# Patient Record
Sex: Male | Born: 1935 | Race: White | Hispanic: No | Marital: Married | Smoking: Former smoker
Health system: Southern US, Community
[De-identification: ages and names within clinical notes are randomized; demographics above are authoritative.]

## PROBLEM LIST (undated history)

## (undated) DIAGNOSIS — M47812 Spondylosis without myelopathy or radiculopathy, cervical region: Secondary | ICD-10-CM

## (undated) DIAGNOSIS — M1990 Unspecified osteoarthritis, unspecified site: Secondary | ICD-10-CM

## (undated) DIAGNOSIS — C61 Malignant neoplasm of prostate: Secondary | ICD-10-CM

## (undated) DIAGNOSIS — I639 Cerebral infarction, unspecified: Secondary | ICD-10-CM

## (undated) DIAGNOSIS — E785 Hyperlipidemia, unspecified: Secondary | ICD-10-CM

## (undated) DIAGNOSIS — I10 Essential (primary) hypertension: Secondary | ICD-10-CM

## (undated) DIAGNOSIS — I4891 Unspecified atrial fibrillation: Secondary | ICD-10-CM

## (undated) HISTORY — DX: Essential (primary) hypertension: I10

## (undated) HISTORY — PX: CAROTID ENDARTERECTOMY: SUR193

## (undated) HISTORY — DX: Malignant neoplasm of prostate: C61

## (undated) HISTORY — DX: Spondylosis without myelopathy or radiculopathy, cervical region: M47.812

## (undated) HISTORY — PX: KNEE SURGERY: SHX244

## (undated) HISTORY — PX: OTHER SURGICAL HISTORY: SHX169

## (undated) HISTORY — DX: Unspecified osteoarthritis, unspecified site: M19.90

## (undated) HISTORY — DX: Cerebral infarction, unspecified: I63.9

## (undated) HISTORY — DX: Unspecified atrial fibrillation: I48.91

## (undated) HISTORY — PX: TONSILLECTOMY: SUR1361

## (undated) HISTORY — DX: Hyperlipidemia, unspecified: E78.5

---

## 1999-02-03 ENCOUNTER — Encounter: Payer: Self-pay | Admitting: Cardiology

## 2000-08-10 ENCOUNTER — Encounter: Admission: RE | Admit: 2000-08-10 | Discharge: 2000-08-10 | Payer: Self-pay | Admitting: Orthopedic Surgery

## 2000-08-10 ENCOUNTER — Encounter: Payer: Self-pay | Admitting: Orthopedic Surgery

## 2001-06-15 ENCOUNTER — Ambulatory Visit: Admission: RE | Admit: 2001-06-15 | Discharge: 2001-06-15 | Payer: Self-pay | Admitting: Gastroenterology

## 2001-06-15 ENCOUNTER — Encounter: Payer: Self-pay | Admitting: Gastroenterology

## 2007-06-04 ENCOUNTER — Encounter: Payer: Self-pay | Admitting: Cardiology

## 2008-12-29 ENCOUNTER — Encounter: Payer: Self-pay | Admitting: Cardiology

## 2008-12-30 ENCOUNTER — Encounter: Payer: Self-pay | Admitting: Cardiology

## 2008-12-31 ENCOUNTER — Encounter: Payer: Self-pay | Admitting: Cardiology

## 2009-02-19 ENCOUNTER — Telehealth (INDEPENDENT_AMBULATORY_CARE_PROVIDER_SITE_OTHER): Payer: Self-pay | Admitting: *Deleted

## 2009-02-19 ENCOUNTER — Encounter: Payer: Self-pay | Admitting: Internal Medicine

## 2009-02-25 ENCOUNTER — Telehealth: Payer: Self-pay | Admitting: Internal Medicine

## 2009-02-26 ENCOUNTER — Ambulatory Visit (HOSPITAL_COMMUNITY): Admission: RE | Admit: 2009-02-26 | Discharge: 2009-02-26 | Payer: Self-pay | Admitting: Internal Medicine

## 2009-02-26 ENCOUNTER — Ambulatory Visit: Payer: Self-pay | Admitting: Internal Medicine

## 2009-02-26 ENCOUNTER — Encounter: Payer: Self-pay | Admitting: Internal Medicine

## 2009-03-08 ENCOUNTER — Telehealth: Payer: Self-pay | Admitting: Cardiology

## 2009-03-10 ENCOUNTER — Encounter: Payer: Self-pay | Admitting: Cardiology

## 2009-05-06 ENCOUNTER — Encounter: Payer: Self-pay | Admitting: Cardiology

## 2009-06-30 ENCOUNTER — Ambulatory Visit: Payer: Self-pay | Admitting: Cardiology

## 2009-06-30 DIAGNOSIS — I10 Essential (primary) hypertension: Secondary | ICD-10-CM

## 2009-06-30 DIAGNOSIS — E876 Hypokalemia: Secondary | ICD-10-CM

## 2009-06-30 DIAGNOSIS — I2510 Atherosclerotic heart disease of native coronary artery without angina pectoris: Secondary | ICD-10-CM | POA: Insufficient documentation

## 2009-06-30 DIAGNOSIS — I6529 Occlusion and stenosis of unspecified carotid artery: Secondary | ICD-10-CM

## 2009-06-30 DIAGNOSIS — E782 Mixed hyperlipidemia: Secondary | ICD-10-CM | POA: Insufficient documentation

## 2009-06-30 DIAGNOSIS — I4891 Unspecified atrial fibrillation: Secondary | ICD-10-CM | POA: Insufficient documentation

## 2009-06-30 DIAGNOSIS — H538 Other visual disturbances: Secondary | ICD-10-CM

## 2009-07-01 ENCOUNTER — Encounter: Payer: Self-pay | Admitting: Cardiology

## 2009-07-09 ENCOUNTER — Encounter (INDEPENDENT_AMBULATORY_CARE_PROVIDER_SITE_OTHER): Payer: Self-pay | Admitting: *Deleted

## 2009-07-14 ENCOUNTER — Ambulatory Visit: Payer: Self-pay | Admitting: Cardiology

## 2009-07-14 LAB — CONVERTED CEMR LAB: POC INR: 3.9

## 2009-07-17 ENCOUNTER — Ambulatory Visit: Payer: Self-pay | Admitting: Cardiology

## 2009-07-17 LAB — CONVERTED CEMR LAB: POC INR: 3

## 2009-07-21 ENCOUNTER — Ambulatory Visit: Payer: Self-pay | Admitting: Cardiology

## 2009-07-21 LAB — CONVERTED CEMR LAB: POC INR: 2.8

## 2009-07-31 ENCOUNTER — Ambulatory Visit: Payer: Self-pay | Admitting: Cardiology

## 2009-07-31 LAB — CONVERTED CEMR LAB: POC INR: 2.2

## 2009-08-06 ENCOUNTER — Ambulatory Visit: Payer: Self-pay | Admitting: Cardiology

## 2009-08-06 DIAGNOSIS — R198 Other specified symptoms and signs involving the digestive system and abdomen: Secondary | ICD-10-CM

## 2009-08-18 ENCOUNTER — Ambulatory Visit: Payer: Self-pay | Admitting: Cardiology

## 2009-08-18 LAB — CONVERTED CEMR LAB: POC INR: 2.1

## 2009-09-08 ENCOUNTER — Ambulatory Visit: Payer: Self-pay | Admitting: Cardiology

## 2009-09-08 LAB — CONVERTED CEMR LAB: POC INR: 2.4

## 2009-10-06 ENCOUNTER — Ambulatory Visit: Payer: Self-pay | Admitting: Cardiology

## 2009-10-06 LAB — CONVERTED CEMR LAB: POC INR: 2.6

## 2009-11-03 ENCOUNTER — Ambulatory Visit: Payer: Self-pay | Admitting: Cardiology

## 2009-11-03 LAB — CONVERTED CEMR LAB: POC INR: 2.2

## 2009-12-01 ENCOUNTER — Ambulatory Visit: Payer: Self-pay | Admitting: Cardiology

## 2009-12-29 ENCOUNTER — Ambulatory Visit: Payer: Self-pay | Admitting: Cardiology

## 2009-12-29 LAB — CONVERTED CEMR LAB: POC INR: 0.9

## 2010-01-12 ENCOUNTER — Ambulatory Visit: Payer: Self-pay | Admitting: Cardiology

## 2010-01-12 LAB — CONVERTED CEMR LAB: POC INR: 1.5

## 2010-01-22 ENCOUNTER — Ambulatory Visit: Payer: Self-pay | Admitting: Cardiology

## 2010-02-05 ENCOUNTER — Ambulatory Visit: Payer: Self-pay | Admitting: Cardiology

## 2010-02-05 LAB — CONVERTED CEMR LAB: POC INR: 3.3

## 2010-02-23 ENCOUNTER — Ambulatory Visit: Payer: Self-pay | Admitting: Cardiology

## 2010-03-09 ENCOUNTER — Ambulatory Visit: Payer: Self-pay | Admitting: Cardiology

## 2010-03-09 LAB — CONVERTED CEMR LAB: POC INR: 3.2

## 2010-04-06 ENCOUNTER — Ambulatory Visit: Payer: Self-pay | Admitting: Cardiology

## 2010-05-04 ENCOUNTER — Ambulatory Visit: Admission: RE | Admit: 2010-05-04 | Discharge: 2010-05-04 | Payer: Self-pay | Source: Home / Self Care

## 2010-05-04 LAB — CONVERTED CEMR LAB: POC INR: 2.2

## 2010-05-27 NOTE — Assessment & Plan Note (Signed)
Summary: NP-AFIB & PARAOXYSMAL  427.31   Visit Type:  Initial Consult Primary Provider:  Vyas  CC:  new patient and irregular heartbeat.  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  There were also tiny acute right occipital lobe infarct.  The patient was evaluated by neurology and it was felt that his stroke was embolic in nature.  Visual field perimetry showed partial right superior field defect consistent with left occipital infarct the patient had a transcranial Doppler study done which was negative.  The patient also the present material echocardiographic study done which showed no definite source of embolism.  However in the interim the patient had a LifeWatch monitor done which demonstrated paroxysmal atrial fibrillation.  Currently the patient is not on anticoagulation.  The patient now presents for a discussion regarding anticoagulation.  Of note is also that the patient has Parkinson's disease but reports no falls.  Although he has a shuffling gait he reports no unsteadiness or gait imbalance.  The patient denies any substernal chest pain shortness of breath orthopnea or PND.  The patient is very reluctant to consider Coumadin therapy.  He doesn't like the idea to be placed on Coumadin with frequent monitoring of his PT/INR.  He states this would interfere with his lifestyle as he has to take care of his sick wife.  Is also noted that these hypertensive in the office today.  Clinical Review Panels:  Echocardiogram Echocardiogram Transesophageal Echocardiography   Study Conclusions    1. Left ventricle: Systolic function was normal.   2. Aortic valve: Mild regurgitation.   3. Left atrium: No evidence of thrombus in the atrial cavity or      appendage.   4. Right atrium: No evidence of thrombus in the atrial cavity or      appendage.   5. Atrial septum: No defect or patent  foramen ovale was identified      with color doppler. Echo contrast study showed no right-to-left      atrial level shunt, at baseline or with provocation.    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 Take 1 Tablet By Mouth Once A Day 2)  Amantadine Hcl 100 Mg Caps (Amantadine Hcl) .... Take 1 Tablet By Mouth Two Times A Day 3)  Omeprazole 20 Mg Cpdr (Omeprazole) .... Take 1 Tablet By Mouth Two Times A Day 4)  Vytorin 10-20 Mg Tabs (Ezetimibe-Simvastatin) .... Take 1 Tablet By Mouth Once A Day 5)  Nabumetone 500 Mg Tabs (Nabumetone) .... Take 1 Tablet By Mouth Two Times A Day  As Needed 6)  Chlorthalidone 25 Mg Tabs (Chlorthalidone) .... Take 1 Tablet By Mouth Once A Day 7)  Carbidopa-Levodopa 25-100 Mg Tabs (Carbidopa-Levodopa) .... Take 1 Tablet By Mouth Four Times A Day 8)  Aspir-Low 81 Mg Tbec (Aspirin) .... Take 1 Tablet By Mouth Once A Day 9)  Vitamin C 500 Mg  Tabs (Ascorbic Acid) .... Take 1 Tablet By Mouth Once A Day 10)  Multivitamins  Tabs (Multiple Vitamin) .... Take 1 Tablet By Mouth Once A Day 11)  Tylenol Extra Strength 500 Mg Tabs (Acetaminophen) .... As Needed 12)  Fish Oil 1200 Mg Caps (Omega-3 Fatty Acids) .... Take 1 Tablet By Mouth Once A Day 13)  Glucosamine-Chondroitin 1500-1200 Mg/63ml Liqd (Glucosamine-Chondroitin) .... Take 1 Tablet By Mouth Once A Day 14)  Natural Herbal Diuretic Plus  Tabs (Misc Natural  Products) .... Take 1 Tablet By Mouth Once A Day 15)  Pradaxa 150 Mg Caps (Dabigatran Etexilate Mesylate) .... Take 1 Tablet By Mouth Two Times A Day  Allergies (verified): No Known Drug Allergies  Comments:  Nurse/Medical Assistant: The patient's medications and allergies were reviewed with the patient and were updated in the Medication and Allergy Lists. List reviewed.  Past History:  Past Medical History: Last updated: July 15, 2009 Hypertension Parkinson;s Disease Prostate Cancer Osteoarthritis Cervical DJD Hyperlipidemia Endarterectomy in the Rt. Carotid 1994  Past Surgical History: Last updated: July 15, 2009 Cataract Extraction Tonsillectomy Knee surgery  Lumbar Diskectomy twice   Family History: Last updated: 07/15/09 Father: had MI and deceased Mother: deceased of old age  Social History: Last updated: 2009-07-15 Alcohol Use - no Retired  Married  Drug Use - no  Risk Factors: Smoking Status: quit (Jul 15, 2009)  Social History: Smoking Status:  quit  Review of Systems  The patient denies fatigue, malaise, fever, weight gain/loss, vision loss, decreased hearing, hoarseness, chest pain, palpitations, shortness of breath, prolonged cough, wheezing, sleep apnea, coughing up blood, abdominal pain, blood in stool, nausea, vomiting, diarrhea, heartburn, incontinence, blood in urine, muscle weakness, joint pain, leg swelling, rash, skin lesions, headache, fainting, dizziness, depression, anxiety, enlarged lymph nodes, easy bruising or bleeding, and environmental allergies.    Vital Signs:  Patient profile:   77 year old male Height:      65 inches Weight:      179 pounds BMI:     29.89 Pulse rate:   69 / minute BP sitting:   149 / 91  (left arm) Cuff size:   regular  Vitals Entered By: Carlye Grippe July 15, 2009 10:08 AM)  Nutrition Counseling: Patient's BMI is greater than 25 and therefore counseled on weight management options. CC: new  patient,irregular heartbeat   Physical Exam  Additional Exam:  General: Well-developed, well-nourished in no distress head: Normocephalic and atraumatic eyes PERRLA/EOMI intact, conjunctiva and lids normal nose: No deformity or lesions mouth normal dentition, normal posterior pharynx neck: Supple, no JVD.  No masses, thyromegaly or abnormal cervical nodes lungs: Normal breath sounds bilaterally without wheezing.  Normal percussion heart: regular rate and rhythm with normal S1 and S2, no S3 or S4.  PMI is normal.  No pathological murmurs abdomen: Normal bowel sounds, abdomen is soft and nontender without masses, organomegaly or hernias noted.  No hepatosplenomegaly musculoskeletal: Back normal, normal gait muscle strength and tone normal pulsus: Pulse is normal in all 4 extremities Extremities: No peripheral pitting edema neurologic: Alert and oriented x 3, the patient has shuffling gait and acatesis. skin: Intact without lesions or rashes cervical nodes: No significant adenopathy psychologic: Normal affect    EKG  Procedure date:  07-15-2009  Findings:      normal sinus rhythm.  Nonspecific ST-T wave changes.  Heart rate 69 beats/min.  Impression & Recommendations:  Problem # 1:  ATRIAL FIBRILLATION (ICD-427.31) the patient has documented paroxysmal atrial fibrillation by LifeWatch monitor.  Clinically he  also had a recent thromboembolic event involving the occipital lobe corroborated by appropriate visual field defects.  I discussed withthe patient daily for added quite a therapy.  For reasons outlined above he declines Coumadin.  However is willing to consider dabigatran therapy.  The patient will be started on dabigatran 150 milligramsp.o. b.i.d. after a BMET has been obtained.  The patient will be monitored for normal renal function.had a long discussion with the patient regarding the risk and benefits of anticoagulant therapy. His updated medication list for this problem  includes:    Aspir-low 81 Mg Tbec (Aspirin) .Marland Kitchen... Take 1 tablet by mouth once a day  Orders: EKG w/ Interpretation (93000) T-Basic Metabolic Panel ([address]-98119)  Problem # 2:  ESSENTIAL HYPERTENSION, BENIGN (ICD-401.1) the patient has uncontrolled high blood pressure.  He will be started on chlorthalidone 25 monos p.o. daily. His updated medication list for this problem includes:    Chlorthalidone 25 Mg Tabs (Chlorthalidone) .Marland Kitchen... Take 1 tablet by mouth once a day    Aspir-low 81 Mg Tbec (Aspirin) .Marland Kitchen... Take 1 tablet by mouth once a day  Problem # 3:  CORONARY ATHEROSCLEROSIS NATIVE CORONARY ARTERY (ICD-414.01) the patient is status post coronary artery bypass grafting.  However clinically there is no evidence of ischemia.  there is no definite indication for Cardiolite stress test at the present time. His updated medication list for this problem includes:    Aspir-low 81 Mg Tbec (Aspirin) .Marland Kitchen... Take 1 tablet by mouth once a day  Orders: T-Basic Metabolic Panel [id number])  Problem # 4:  OTHER SPECIFIED VISUAL DISTURBANCES (ICD-368.8) Assessment: Comment Only  Patient Instructions: 1)  Pradaxa 150mg  two times a day  2)  Need BMET prior to starting.  3)  Stop HCTZ 4)  Start Chlorhtalidone 25mg  daily 5)  Follow up in  1 month.  (April 14 at 8:45, Thursday) Prescriptions: CHLORTHALIDONE 25 MG TABS (CHLORTHALIDONE) Take 1 tablet by mouth once a day  #30 x 6   Entered by:   Hoover Brunette, LPN   Authorized by:   Lewayne Bunting, MD, [facility]   Signed by:   Hoover Brunette, LPN on 30/86/5784   Method used:   Electronically to        [facility]* (retail)       [address]       [place], [place]  [address]       Ph: [phone]       Fax: [phone]   RxID:   [id number]   Handout requested. PRADAXA 150 MG CAPS (DABIGATRAN ETEXILATE MESYLATE) Take 1 tablet by mouth two times a day  #60 x 6   Entered by:   Hoover Brunette, LPN   Authorized by:   Lewayne Bunting, MD, [facility]   Signed  by:   Hoover Brunette, LPN on 43/32/9518   Method used:   Electronically to        [facility]* (retail)       [address]       [place], [place]  [address]       Ph: [phone]       Fax: [phone]   RxID:   [id number]   Handout requested.    Appended Document: NP-AFIB & PARAOXYSMAL  427.31 Pt. has decided to start Coumadin instead of Pradaxa due to cost.  Dr. Andee Lineman aware and okay to start on 5mg  daily.  Will start tomorrow  with first check in our coumadin clinic on Tuesday, 3/22 at 3:45.  Patient notified.   Will send rx to [facility].    Clinical Lists Changes  Medications: Added new medication of WARFARIN SODIUM 5 MG TABS (WARFARIN SODIUM) take as directed per coumadin clinic - Signed Rx of WARFARIN SODIUM 5 MG TABS (WARFARIN SODIUM) take as directed per coumadin clinic;  #30 x 2;  Signed;  Entered by: Hoover Brunette, LPN;  Authorized by: Lewayne Bunting, MD, [facility];  Method used: Electronically to [facility] [address], [place], [place], [place]  [address], Ph: [phone], Fax: [phone]    Prescriptions: WARFARIN SODIUM 5 MG TABS (WARFARIN SODIUM) take as directed per coumadin clinic  #30 x 2   Entered by:   Hoover Brunette, LPN   Authorized by:   Lewayne Bunting, MD, [facility]   Signed by:   Hoover Brunette, LPN on 78/46/9629   Method used:   Electronically to        [facility] (retail)       [address]       [place], [place]  [address]       Ph: [phone]       Fax: [phone]   RxID:   [id number]    Appended Document: NP-AFIB & PARAOXYSMAL  427.31 Decrease pt's ASA to 81mg  after therapeutic per Dr. Andee Lineman.

## 2010-05-27 NOTE — Medication Information (Signed)
Summary: ccr-lr  Anticoagulant Therapy  Managed by: Vashti Hey, RN PCP: Lucianne Muss MD: Antoine Poche MD, Fayrene Fearing Indication 1: Atrial Fibrillation Lab Used: [facility] [place] Site: [place] INR POC 2.5  Dietary changes: no    Health status changes: no    Bleeding/hemorrhagic complications: no    Recent/future hospitalizations: no    Any changes in medication regimen? no    Recent/future dental: no  Any missed doses?: no       Is patient compliant with meds? yes       Allergies: No Known Drug Allergies  Anticoagulation Management History:      The patient is taking warfarin and comes in today for a routine follow up visit.  Positive risk factors for bleeding include an age of 19 years or older and history of CVA/TIA.  Negative risk factors for bleeding include no history of GI bleeding and absence of serious comorbidities.  The bleeding index is 'intermediate risk'.  Positive CHADS2 values include History of HTN, Age > 79 years old, and Prior Stroke/CVA/TIA.  Negative CHADS2 values include History of CHF and History of Diabetes.  Anticoagulation responsible Seraya Jobst: Antoine Poche MD, Fayrene Fearing.  INR POC: 2.5.  Cuvette Lot#: [id number].    Anticoagulation Management Assessment/Plan:      The patient's current anticoagulation dose is Warfarin sodium 5 mg tabs: take as directed per coumadin clinic.  The target INR is 2.0-3.0.  The next INR is due 12/29/2009.  Anticoagulation instructions were given to patient.  Results were reviewed/authorized by Vashti Hey, RN.  He was notified by Vashti Hey RN.         Prior Anticoagulation Instructions: INR 2.2 Continue coumadin 2.5mg  once daily except none on Wednesdays  Current Anticoagulation Instructions: INR 2.5 Continue coumadin 2.5mg  once daily except none on Wenesdays

## 2010-05-27 NOTE — Medication Information (Signed)
Summary: ccr-lr  Anticoagulant Therapy  Managed by: Vashti Hey, RN PCP: Lucianne Muss MD: Diona Browner MD, Remi Deter Indication 1: Atrial Fibrillation Lab Used: [facility] [place] Site: [place] INR POC 4.5  Dietary changes: no    Health status changes: no    Bleeding/hemorrhagic complications: no    Recent/future hospitalizations: no    Any changes in medication regimen? no    Recent/future dental: no  Any missed doses?: no       Is patient compliant with meds? yes       Allergies: No Known Drug Allergies  Anticoagulation Management History:      The patient is taking warfarin and comes in today for a routine follow up visit.  Positive risk factors for bleeding include an age of 75 years or older and history of CVA/TIA.  Negative risk factors for bleeding include no history of GI bleeding and absence of serious comorbidities.  The bleeding index is 'intermediate risk'.  Positive CHADS2 values include History of HTN, Age > 75 years old, and Prior Stroke/CVA/TIA.  Negative CHADS2 values include History of CHF and History of Diabetes.  Anticoagulation responsible provider: Diona Browner MD, Remi Deter.  INR POC: 4.5.  Cuvette Lot#: [id number].    Anticoagulation Management Assessment/Plan:      The patient's current anticoagulation dose is Warfarin sodium 5 mg tabs: take as directed per coumadin clinic.  The target INR is 2.0-3.0.  The next INR is due 02/05/2010.  Anticoagulation instructions were given to patient.  Results were reviewed/authorized by Vashti Hey, RN.  He was notified by Vashti Hey RN.         Prior Anticoagulation Instructions: INR 1.5 Has been off coumadin 3 days Take coumadin 5mg  tonight then resume 2.5mg  once daily except none on Wednesdays  Current Anticoagulation Instructions: INR 4.5 Hold coumadin tonight then resume 2.5mg  once daily except none on Wednesdays

## 2010-05-27 NOTE — Medication Information (Signed)
Summary: ccr-lr  Anticoagulant Therapy  Managed by: Vashti Hey, RN PCP: Lucianne Muss MD: Andee Lineman MD, Michelle Piper Indication 1: Atrial Fibrillation Lab Used: [facility] [place] Site: [place] INR POC 3.3  Dietary changes: no    Health status changes: no    Bleeding/hemorrhagic complications: no    Recent/future hospitalizations: no    Any changes in medication regimen? no    Recent/future dental: no  Any missed doses?: no       Is patient compliant with meds? yes       Allergies: No Known Drug Allergies  Anticoagulation Management History:      The patient is taking warfarin and comes in today for a routine follow up visit.  Positive risk factors for bleeding include an age of 75 years or older and history of CVA/TIA.  Negative risk factors for bleeding include no history of GI bleeding and absence of serious comorbidities.  The bleeding index is 'intermediate risk'.  Positive CHADS2 values include History of HTN, Age > 46 years old, and Prior Stroke/CVA/TIA.  Negative CHADS2 values include History of CHF and History of Diabetes.  Anticoagulation responsible provider: Andee Lineman MD, Michelle Piper.  INR POC: 3.3.  Cuvette Lot#: [id number].    Anticoagulation Management Assessment/Plan:      The patient's current anticoagulation dose is Warfarin sodium 5 mg tabs: take as directed per coumadin clinic.  The target INR is 2.0-3.0.  The next INR is due 03/05/2010.  Anticoagulation instructions were given to patient.  Results were reviewed/authorized by Vashti Hey, RN.  He was notified by Vashti Hey RN.         Prior Anticoagulation Instructions: INR 4.5 Hold coumadin tonight then resume 2.5mg  once daily except none on Wednesdays  Current Anticoagulation Instructions: INR 3.3 Take coumadin 1/4 tablet tonight then resume 2.5mg  once daily except none on Wednesdays

## 2010-05-27 NOTE — Medication Information (Signed)
Summary: ccr-lr  Anticoagulant Therapy  Managed by: Vashti Hey, RN PCP: Lucianne Muss MD: Andee Lineman MD, Michelle Piper Indication 1: Atrial Fibrillation Lab Used: [facility] [place] Site: [place] INR POC 2.2  Dietary changes: no    Health status changes: no    Bleeding/hemorrhagic complications: no    Recent/future hospitalizations: no    Any changes in medication regimen? no    Recent/future dental: no  Any missed doses?: yes     Details: Missed 2 doses  Is patient compliant with meds? yes       Allergies: No Known Drug Allergies  Anticoagulation Management History:      The patient is taking warfarin and comes in today for a routine follow up visit.  Positive risk factors for bleeding include an age of 75 years or older and history of CVA/TIA.  Negative risk factors for bleeding include no history of GI bleeding and absence of serious comorbidities.  The bleeding index is 'intermediate risk'.  Positive CHADS2 values include History of HTN, Age > 35 years old, and Prior Stroke/CVA/TIA.  Negative CHADS2 values include History of CHF and History of Diabetes.  Anticoagulation responsible provider: Andee Lineman MD, Michelle Piper.  INR POC: 2.2.  Cuvette Lot#: [id number].    Anticoagulation Management Assessment/Plan:      The patient's current anticoagulation dose is Warfarin sodium 5 mg tabs: take as directed per coumadin clinic.  The target INR is 2.0-3.0.  The next INR is due 04/06/2010.  Anticoagulation instructions were given to patient.  Results were reviewed/authorized by Vashti Hey, RN.  He was notified by Vashti Hey RN.         Prior Anticoagulation Instructions: INR 3.2 Continue coumadin 2.5mg  once daily except none on Wednesdays May need to change tablet strength to 2.5mg  next visit  Current Anticoagulation Instructions: INR 2.2 Continue coumadin 2.5mg  once daily except none on Wednesdays

## 2010-05-27 NOTE — Letter (Signed)
Summary: External Correspondence/ OFFICE VISIT [facility]  External Correspondence/ OFFICE VISIT [facility]   Imported By: Dorise Hiss 06/30/2009 10:18:40  _____________________________________________________________________  External Attachment:    Type:   Image     Comment:   External Document

## 2010-05-27 NOTE — Assessment & Plan Note (Signed)
Summary: 1 MO FU -SRS   Visit Type:  Follow-up Primary Provider:  Sherril Croon  CC:  follow-up visit.  History of Present Illness: the patient is a 77 year old male with a history of prior CVA, paroxysmal atrial fibrillation, on Coumadin therapy as well as is her Parkinson's disease. The patient presents for followup. He reports no recurrent palpitations. During activity he feels some increasing heart rates but is not really symptomatic. He feels that his legs get weak sometimes but denies any frank claudication. He denies any chest pain orthopnea PND. He has difficulty with bloating as well as poorly formed stools. He denies any frank diarrhea.  Is otherwise stable from a cardiovascular standpoint and reports no complications related to Coumadin.  Clinical Review Panels:  Echocardiogram Echocardiogram Transesophageal Echocardiography   Study Conclusions    1. Left ventricle: Systolic function was normal.   2. Aortic valve: Mild regurgitation.   3. Left atrium: No evidence of thrombus in the atrial cavity or      appendage.   4. Right atrium: No evidence of thrombus in the atrial cavity or      appendage.   5. Atrial septum: No defect or patent foramen ovale was identified      with color doppler. Echo contrast study showed no right-to-left      atrial level shunt, at baseline or with provocation.    (02/26/2009)    Preventive Screening-Counseling & Management  Alcohol-Tobacco     Smoking Status: quit     Year Quit: 1977  Current Medications (verified): 1)  Azilect 1 Mg Tabs (Rasagiline Mesylate) .... Take 1 Tablet By Mouth Once A Day 2)  Amantadine Hcl 100 Mg Caps (Amantadine Hcl) .... Take 1 Tablet By Mouth Two Times A Day 3)  Omeprazole 20 Mg Cpdr (Omeprazole) .... Take 1 Tablet By Mouth Two Times A Day 4)  Vytorin 10-20 Mg Tabs (Ezetimibe-Simvastatin) .... Take 1 Tablet By Mouth Once A Day 5)  Nabumetone 500 Mg Tabs (Nabumetone) .... Take 1 Tablet By Mouth Two Times A Day  As  Needed 6)  Chlorthalidone 25 Mg Tabs (Chlorthalidone) .... Take 1 Tablet By Mouth Once A Day 7)  Carbidopa-Levodopa 25-100 Mg Tabs (Carbidopa-Levodopa) .... Take 1 Tablet By Mouth Four Times A Day 8)  Vitamin C 500 Mg Tabs (Ascorbic Acid) .... Take 1 Tablet By Mouth Once A Day 9)  Multivitamins  Tabs (Multiple Vitamin) .... Take 1 Tablet By Mouth Once A Day 10)  Tylenol Extra Strength 500 Mg Tabs (Acetaminophen) .... As Needed 11)  Fish Oil 1200 Mg Caps (Omega-3 Fatty Acids) .... Take 1 Tablet By Mouth Once A Day 12)  Glucosamine-Chondroitin 1500-1200 Mg/28ml Liqd (Glucosamine-Chondroitin) .... Take 1 Tablet By Mouth Once A Day 13)  Natural Herbal Diuretic Plus  Tabs (Misc Natural Products) .... Take 1 Tablet By Mouth Once A Day 14)  Pradaxa 150 Mg Caps (Dabigatran Etexilate Mesylate) .... Take 1 Tablet By Mouth Two Times A Day 15)  Warfarin Sodium 5 Mg Tabs (Warfarin Sodium) .... Take As Directed Per Coumadin Clinic  Allergies (verified): No Known Drug Allergies  Comments:  Nurse/Medical Assistant: The patient is currently on medications but does not know the name or dosage at this time. Instructed to contact our office with details. Will update medication list at that time.  Past History:  Past Medical History: Last updated: 06/30/2009 Hypertension Parkinson;s Disease Prostate Cancer Osteoarthritis Cervical DJD Hyperlipidemia Endarterectomy in the Rt. Carotid 1994  Past Surgical History: Last updated: 06/30/2009 Cataract  Extraction Tonsillectomy Knee surgery  Lumbar Diskectomy twice   Family History: Last updated: 2009-07-10 Father: had MI and deceased Mother: deceased of old age  Social History: Last updated: Jul 10, 2009 Alcohol Use - no Retired  Married  Drug Use - no  Risk Factors: Smoking Status: quit (08/06/2009)  Review of Systems       The patient complains of shortness of breath.  The patient denies fatigue, malaise, fever, weight gain/loss, vision  loss, decreased hearing, hoarseness, chest pain, palpitations, prolonged cough, wheezing, sleep apnea, coughing up blood, abdominal pain, blood in stool, nausea, vomiting, diarrhea, heartburn, incontinence, blood in urine, muscle weakness, joint pain, leg swelling, rash, skin lesions, headache, fainting, dizziness, depression, anxiety, enlarged lymph nodes, easy bruising or bleeding, and environmental allergies.    Vital Signs:  Patient profile:   77 year old male Height:      65 inches Weight:      183 pounds Pulse rate:   61 / minute BP sitting:   137 / 86  (left arm) Cuff size:   regular  Vitals Entered By: Carlye Grippe (August 06, 2009 8:50 AM) CC: follow-up visit   Physical Exam  Additional Exam:  General: Well-developed, well-nourished in no distress head: Normocephalic and atraumatic eyes PERRLA/EOMI intact, conjunctiva and lids normal nose: No deformity or lesions mouth normal dentition, normal posterior pharynx neck: Supple, no JVD.  No masses, thyromegaly or abnormal cervical nodes lungs: Normal breath sounds bilaterally without wheezing.  Normal percussion heart: regular rate and rhythm with normal S1 and S2, no S3 or S4.  PMI is normal.  No pathological murmurs abdomen: Normal bowel sounds, abdomen is soft and nontender without masses, organomegaly or hernias noted.  No hepatosplenomegaly musculoskeletal: Back normal, normal gait muscle strength and tone normal pulsus: Pulse is normal in all 4 extremities Extremities: No peripheral pitting edema neurologic: Alert and oriented x 3, the patient has shuffling gait and acatesis. skin: Intact without lesions or rashes cervical nodes: No significant adenopathy psychologic: Normal affect    EKG  Procedure date:  08/06/2009  Findings:      normal sinus rhythm with sinus arrhythmia nonspecific ST-T wave changes  Impression & Recommendations:  Problem # 1:  ESSENTIAL HYPERTENSION, BENIGN (ICD-401.1) controlled. His  updated medication list for this problem includes:    Chlorthalidone 25 Mg Tabs (Chlorthalidone) .Marland Kitchen... Take 1 tablet by mouth once a day  Problem # 2:  ATRIAL FIBRILLATION (ICD-427.31) no recurrence. EKG demonstrates normal sinus rhythm with nonspecific ST-T wave changes His updated medication list for this problem includes:    Warfarin Sodium 5 Mg Tabs (Warfarin sodium) .Marland Kitchen... Take as directed per coumadin clinic  Orders: EKG w/ Interpretation (93000)  Problem # 3:  COUMADIN THERAPY (ICD-V58.61) Assessment: Comment Only  Problem # 4:  CHANGE IN BOWELS (ICD-787.99) I recommended Metamucil 1 teaspoon q. daily  Patient Instructions: 1)  Metamucil - one teaspoon / 8 oz. daily 2)  Follow up in  6 months

## 2010-05-27 NOTE — Medication Information (Signed)
Summary: ccr-lr  Anticoagulant Therapy  Managed by: Vashti Hey, RN PCP: Lucianne Muss MD: Diona Browner MD, Remi Deter Indication 1: Atrial Fibrillation Lab Used: [facility] [place] Site: [place] INR POC 2.2  Dietary changes: no    Health status changes: no    Bleeding/hemorrhagic complications: no    Recent/future hospitalizations: no    Any changes in medication regimen? no    Recent/future dental: no  Any missed doses?: no       Is patient compliant with meds? yes       Allergies: No Known Drug Allergies  Anticoagulation Management History:      The patient is taking warfarin and comes in today for a routine follow up visit.  Positive risk factors for bleeding include an age of 75 years or older and history of CVA/TIA.  Negative risk factors for bleeding include no history of GI bleeding and absence of serious comorbidities.  The bleeding index is 'intermediate risk'.  Positive CHADS2 values include History of HTN, Age > 30 years old, and Prior Stroke/CVA/TIA.  Negative CHADS2 values include History of CHF and History of Diabetes.  Anticoagulation responsible provider: Diona Browner MD, Remi Deter.  INR POC: 2.2.  Cuvette Lot#: [id number].    Anticoagulation Management Assessment/Plan:      The patient's current anticoagulation dose is Warfarin sodium 5 mg tabs: take as directed per coumadin clinic.  The next INR is due 08/18/2009.  Anticoagulation instructions were given to patient.  Results were reviewed/authorized by Vashti Hey, RN.  He was notified by Vashti Hey RN.         Prior Anticoagulation Instructions: INR 2.8 Decrease coumadin to 2.5mg  once daily except none on Wednesdays  Current Anticoagulation Instructions: INR 2.2 Continue coumadin 2.5mg  once daily except none on Wednesdays

## 2010-05-27 NOTE — Medication Information (Signed)
Summary: ccr-lr  Anticoagulant Therapy  Managed by: Vashti Hey, RN PCP: Lucianne Muss MD: Andee Lineman MD, Michelle Piper Indication 1: Atrial Fibrillation Lab Used: [facility] [place] Site: [place] INR POC 2.2  Dietary changes: no    Health status changes: no    Bleeding/hemorrhagic complications: no    Recent/future hospitalizations: no    Any changes in medication regimen? no    Recent/future dental: no  Any missed doses?: no       Is patient compliant with meds? yes       Allergies: No Known Drug Allergies  Anticoagulation Management History:      The patient is taking warfarin and comes in today for a routine follow up visit.  Positive risk factors for bleeding include an age of 75 years or older and history of CVA/TIA.  Negative risk factors for bleeding include no history of GI bleeding and absence of serious comorbidities.  The bleeding index is 'intermediate risk'.  Positive CHADS2 values include History of HTN, Age > 75 years old, and Prior Stroke/CVA/TIA.  Negative CHADS2 values include History of CHF and History of Diabetes.  Anticoagulation responsible provider: Andee Lineman MD, Michelle Piper.  INR POC: 2.2.  Cuvette Lot#: [id number].    Anticoagulation Management Assessment/Plan:      The patient's current anticoagulation dose is Warfarin sodium 5 mg tabs: take as directed per coumadin clinic.  The next INR is due 12/01/2009.  Anticoagulation instructions were given to patient.  Results were reviewed/authorized by Vashti Hey, RN.  He was notified by Vashti Hey RN.         Prior Anticoagulation Instructions: INR 2.6 Continue coumadin 2.5mg  once daily except none on Wednesdays  Current Anticoagulation Instructions: INR 2.2 Continue coumadin 2.5mg  once daily except none on Wednesdays Prescriptions: WARFARIN SODIUM 5 MG TABS (WARFARIN SODIUM) take as directed per coumadin clinic  #30 x 2   Entered by:   Vashti Hey RN   Authorized by:   Lewayne Bunting, MD, [facility]   Signed by:   Vashti Hey  RN on 11/03/2009   Method used:   Electronically to        [facility]* (retail)       [address]       [place], [place]  [address]       Ph: [phone]       Fax: [phone]   RxID:   [id number]

## 2010-05-27 NOTE — Assessment & Plan Note (Signed)
Summary: 6 mo fu   Visit Type:  Follow-up Primary Hakim [place]:  Sherril Croon   History of Present Illness: the patient is a 77 year old male with a history of prior CVA, bruxism intrafibrillation on Coumadin therapy.  Patient also has Parkinson's disease.  From a cardiac standpoint he is doing well.  He denies any chest pain shortness of breath orthopnea PND.  He has no palpitations.  His EKG was reviewed today and he is in sinus rhythm.  Preventive Screening-Counseling & Management  Alcohol-Tobacco     Smoking Status: quit     Year Quit: 1977  Current Medications (verified): 1)  Azilect 1 Mg Tabs (Rasagiline Mesylate) .... Take 1 Tablet By Mouth Once A Day 2)  Amantadine Hcl 100 Mg Caps (Amantadine Hcl) .... Take 1 Tablet By Mouth Two Times A Day 3)  Omeprazole 20 Mg Cpdr (Omeprazole) .... Take 1 Tablet By Mouth Two Times A Day 4)  Vytorin 10-20 Mg Tabs (Ezetimibe-Simvastatin) .... Take 1 Tablet By Mouth Once A Day 5)  Chlorthalidone 25 Mg Tabs (Chlorthalidone) .... Take 1 Tablet By Mouth Once A Day 6)  Carbidopa-Levodopa 25-100 Mg Tabs (Carbidopa-Levodopa) .... Take 1 Tablet By Mouth Four Times A Day 7)  Vitamin C 500 Mg Tabs (Ascorbic Acid) .... Take 1 Tablet By Mouth Once A Day 8)  Multivitamins  Tabs (Multiple Vitamin) .... Take 1 Tablet By Mouth Once A Day 9)  Tylenol Extra Strength 500 Mg Tabs (Acetaminophen) .... As Needed 10)  Fish Oil 1200 Mg Caps (Omega-3 Fatty Acids) .... Take 1 Tablet By Mouth Once A Day 11)  Glucosamine-Chondroitin 1500-1200 Mg/45ml Liqd (Glucosamine-Chondroitin) .... Take 1 Tablet By Mouth Once A Day 12)  Warfarin Sodium 5 Mg Tabs (Warfarin Sodium) .... Take As Directed Per Coumadin Clinic 13)  Ondansetron 4 Mg Tbdp (Ondansetron) .... Take One Tablet As Needed Nausea, Max 2 Tabs in 24 Hrs  Allergies (verified): No Known Drug Allergies  Comments:  Nurse/Medical Assistant: The patient's medication list and allergies were reviewed with the patient and were  updated in the Medication and Allergy Lists.  Past History:  Past Medical History: Last updated: 07-06-09 Hypertension Parkinson;s Disease Prostate Cancer Osteoarthritis Cervical DJD Hyperlipidemia Endarterectomy in the Rt. Carotid 1994  Past Surgical History: Last updated: 2009/07/06 Cataract Extraction Tonsillectomy Knee surgery  Lumbar Diskectomy twice   Family History: Last updated: Jul 06, 2009 Father: had MI and deceased Mother: deceased of old age  Social History: Last updated: 07-06-2009 Alcohol Use - no Retired  Married  Drug Use - no  Risk Factors: Smoking Status: quit (02/23/2010)  Vital Signs:  Patient profile:   77 year old male Height:      65 inches Weight:      169 pounds Pulse rate:   62 / minute BP sitting:   127 / 81  (left arm) Cuff size:   regular  Vitals Entered By: Carlye Grippe (February 23, 2010 10:07 AM)  Physical Exam  Additional Exam:  General: Well-developed, well-nourished in no distress head: Normocephalic and atraumatic eyes PERRLA/EOMI intact, conjunctiva and lids normal nose: No deformity or lesions mouth normal dentition, normal posterior pharynx neck: Supple, no JVD.  No masses, thyromegaly or abnormal cervical nodes lungs: Normal breath sounds bilaterally without wheezing.  Normal percussion heart: regular rate and rhythm with normal S1 and S2, no S3 or S4.  PMI is normal.  No pathological murmurs abdomen: Normal bowel sounds, abdomen is soft and nontender without masses, organomegaly or hernias noted.  No hepatosplenomegaly musculoskeletal:  Back normal, normal gait muscle strength and tone normal pulsus: Pulse is normal in all 4 extremities Extremities: No peripheral pitting edema neurologic: Alert and oriented x 3, the patient has shuffling gait and acatesis. skin: Intact without lesions or rashes cervical nodes: No significant adenopathy psychologic: Normal affect    EKG  Procedure date:   02/23/2010  Findings:      normal sinus rhythm.  Normal EKG  Impression & Recommendations:  Problem # 1:  COUMADIN THERAPY (ICD-V58.61) patient is followed in the Coumadin clinic.  He reports no bleeding complications.  Problem # 2:  ATRIAL FIBRILLATION (ICD-427.31) no recurrent palpitations and the patient remains in normal sinus rhythm. His updated medication list for this problem includes:    Warfarin Sodium 5 Mg Tabs (Warfarin sodium) .Marland Kitchen... Take as directed per coumadin clinic  Orders: EKG w/ Interpretation (93000)  Problem # 3:  ESSENTIAL HYPERTENSION, BENIGN (ICD-401.1) patient's blood pressures under good control.  Continue medical therapy. His updated medication list for this problem includes:    Chlorthalidone 25 Mg Tabs (Chlorthalidone) .Marland Kitchen... Take 1 tablet by mouth once a day  Orders: EKG w/ Interpretation (93000)  Problem # 4:  MIXED HYPERLIPIDEMIA (ICD-272.2) Assessment: Comment Only  His updated medication list for this problem includes:    Vytorin 10-20 Mg Tabs (Ezetimibe-simvastatin) .Marland Kitchen... Take 1 tablet by mouth once a day  Patient Instructions: 1)  Zofran 4mg  as needed nausea  2)  Follow up in  6 months Prescriptions: ONDANSETRON 4 MG TBDP (ONDANSETRON) take one tablet as needed nausea, max 2 tabs in 24 hrs  #30 x 1   Entered by:   Hoover Brunette, LPN   Authorized by:   Lewayne Bunting, MD, [facility]   Signed by:   Hoover Brunette, LPN on 09/81/1914   Method used:   Electronically to        [facility]* (retail)       [address]       [place], [place]  [address]       Ph: [phone]       Fax: [phone]   RxID:   [id number]

## 2010-05-27 NOTE — Miscellaneous (Signed)
Summary: med update  Clinical Lists Changes  Medications: Removed medication of ASPIR-LOW 81 MG TBEC (ASPIRIN) Take 1 tablet by mouth once a day

## 2010-05-27 NOTE — Medication Information (Signed)
Summary: ccr-lr  Anticoagulant Therapy  Managed by: Vashti Hey, RN PCP: Lucianne Muss MD: Andee Lineman MD, Michelle Piper Indication 1: Atrial Fibrillation Lab Used: [facility] [place] Site: [place] INR POC 3.0  Dietary changes: no    Health status changes: no    Bleeding/hemorrhagic complications: no    Recent/future hospitalizations: no    Any changes in medication regimen? no    Recent/future dental: no  Any missed doses?: no       Is patient compliant with meds? yes       Allergies: No Known Drug Allergies  Anticoagulation Management History:      The patient is taking warfarin and comes in today for a routine follow up visit.  Positive risk factors for bleeding include an age of 75 years or older and history of CVA/TIA.  Negative risk factors for bleeding include no history of GI bleeding and absence of serious comorbidities.  The bleeding index is 'intermediate risk'.  Positive CHADS2 values include History of HTN, Age > 13 years old, and Prior Stroke/CVA/TIA.  Negative CHADS2 values include History of CHF and History of Diabetes.  Anticoagulation responsible provider: Andee Lineman MD, Michelle Piper.  INR POC: 3.0.  Cuvette Lot#: [id number].    Anticoagulation Management Assessment/Plan:      The patient's current anticoagulation dose is Warfarin sodium 5 mg tabs: take as directed per coumadin clinic.  The next INR is due 07/21/2009.  Anticoagulation instructions were given to patient.  Results were reviewed/authorized by Vashti Hey, RN.  He was notified by Vashti Hey RN.         Prior Anticoagulation Instructions: INR 3.9 Hold coumadin tonight then decrease dose to 2.5mg  once daily   Current Anticoagulation Instructions: INR 3.0 Hold coumadin tonight then resume 2.5mg  once daily

## 2010-05-27 NOTE — Procedures (Signed)
Summary: [facility]/ [facility]  [facility]/ [facility]   Imported By: Dorise Hiss 06/30/2009 10:20:58  _____________________________________________________________________  External Attachment:    Type:   Image     Comment:   External Document

## 2010-05-27 NOTE — Medication Information (Signed)
Summary: ccr-lr  Anticoagulant Therapy  Managed by: Vashti Hey, RN PCP: Lucianne Muss MD: Andee Lineman MD, Michelle Piper Indication 1: Atrial Fibrillation Lab Used: [facility] [place] Site: [place] INR POC 2.6  Dietary changes: no    Health status changes: no    Bleeding/hemorrhagic complications: no    Recent/future hospitalizations: no    Any changes in medication regimen? no    Recent/future dental: no  Any missed doses?: no       Is patient compliant with meds? yes       Allergies: No Known Drug Allergies  Anticoagulation Management History:      The patient is taking warfarin and comes in today for a routine follow up visit.  Positive risk factors for bleeding include an age of 75 years or older and history of CVA/TIA.  Negative risk factors for bleeding include no history of GI bleeding and absence of serious comorbidities.  The bleeding index is 'intermediate risk'.  Positive CHADS2 values include History of HTN, Age > 46 years old, and Prior Stroke/CVA/TIA.  Negative CHADS2 values include History of CHF and History of Diabetes.  Anticoagulation responsible provider: Andee Lineman MD, Michelle Piper.  INR POC: 2.6.  Cuvette Lot#: [id number].    Anticoagulation Management Assessment/Plan:      The patient's current anticoagulation dose is Warfarin sodium 5 mg tabs: take as directed per coumadin clinic.  The next INR is due 11/03/2009.  Anticoagulation instructions were given to patient.  Results were reviewed/authorized by Vashti Hey, RN.  He was notified by Vashti Hey RN.         Prior Anticoagulation Instructions: INR 2.4 Continue coumadin 2.5mg  once daily except none on Wednesdays  Current Anticoagulation Instructions: INR 2.6 Continue coumadin 2.5mg  once daily except none on Wednesdays

## 2010-05-27 NOTE — Medication Information (Signed)
Summary: ccr-lr  Anticoagulant Therapy  Managed by: Vashti Hey, RN PCP: Lucianne Muss MD: Andee Lineman MD, Michelle Piper Indication 1: Atrial Fibrillation Lab Used: [facility] [place] Site: [place] INR POC 2.2  Dietary changes: no    Health status changes: no    Bleeding/hemorrhagic complications: no    Recent/future hospitalizations: no    Any changes in medication regimen? no    Recent/future dental: no  Any missed doses?: no       Is patient compliant with meds? yes       Allergies: No Known Drug Allergies  Anticoagulation Management History:      The patient is taking warfarin and comes in today for a routine follow up visit.  Positive risk factors for bleeding include an age of 74 years or older and history of CVA/TIA.  Negative risk factors for bleeding include no history of GI bleeding and absence of serious comorbidities.  The bleeding index is 'intermediate risk'.  Positive CHADS2 values include History of HTN, Age > 67 years old, and Prior Stroke/CVA/TIA.  Negative CHADS2 values include History of CHF and History of Diabetes.  Anticoagulation responsible provider: Andee Lineman MD, Michelle Piper.  INR POC: 2.2.  Cuvette Lot#: [id number].    Anticoagulation Management Assessment/Plan:      The patient's current anticoagulation dose is Warfarin sodium 5 mg tabs: take as directed per coumadin clinic.  The target INR is 2.0-3.0.  The next INR is due 06/08/2010.  Anticoagulation instructions were given to patient.  Results were reviewed/authorized by Vashti Hey, RN.  He was notified by Vashti Hey RN.         Prior Anticoagulation Instructions: INR 2.2 Continue coumadin 2.5mg  once daily except none on Wednesdays  Current Anticoagulation Instructions: Same as Prior Instructions.

## 2010-05-27 NOTE — Medication Information (Signed)
Summary: ccr-lr  Anticoagulant Therapy  Managed by: Vashti Hey, RN PCP: Lucianne Muss MD: Diona Browner MD, Remi Deter Indication 1: Atrial Fibrillation Lab Used: [facility]  Site: [place] INR POC 3.2  Dietary changes: no    Health status changes: no    Bleeding/hemorrhagic complications: no    Recent/future hospitalizations: no    Any changes in medication regimen? no    Recent/future dental: no  Any missed doses?: no       Is patient compliant with meds? yes       Allergies: No Known Drug Allergies  Anticoagulation Management History:      The patient is taking warfarin and comes in today for a routine follow up visit.  Positive risk factors for bleeding include an age of 75 years or older and history of CVA/TIA.  Negative risk factors for bleeding include no history of GI bleeding and absence of serious comorbidities.  The bleeding index is 'intermediate risk'.  Positive CHADS2 values include History of HTN, Age > 32 years old, and Prior Stroke/CVA/TIA.  Negative CHADS2 values include History of CHF and History of Diabetes.  Anticoagulation responsible provider: Diona Browner MD, Remi Deter.  INR POC: 3.2.  Cuvette Lot#: [id number].    Anticoagulation Management Assessment/Plan:      The patient's current anticoagulation dose is Warfarin sodium 5 mg tabs: take as directed per coumadin clinic.  The target INR is 2.0-3.0.  The next INR is due 04/06/2010.  Anticoagulation instructions were given to patient.  Results were reviewed/authorized by Vashti Hey, RN.  He was notified by Vashti Hey RN.         Prior Anticoagulation Instructions: INR 3.3 Take coumadin 1/4 tablet tonight then resume 2.5mg  once daily except none on Wednesdays  Current Anticoagulation Instructions: INR 3.2 Continue coumadin 2.5mg  once daily except none on Wednesdays May need to change tablet strength to 2.5mg  next visit

## 2010-05-27 NOTE — Letter (Signed)
Summary: [facility] D/C DR. DHRUV VYAS  [facility] D/C DR. DHRUV VYAS   Imported By: Zachary George 06/30/2009 09:04:46  _____________________________________________________________________  External Attachment:    Type:   Image     Comment:   External Document

## 2010-05-27 NOTE — Medication Information (Signed)
Summary: ccn-started on 5mg  07/10/09-lr  Anticoagulant Therapy  Managed by: Vashti Hey, RN PCP: Lucianne Muss MD: Andee Lineman MD, Michelle Piper Indication 1: Atrial Fibrillation Lab Used: [facility] [place] Site: [place] INR POC 3.9  Dietary changes: no    Health status changes: no    Bleeding/hemorrhagic complications: no    Recent/future hospitalizations: no    Any changes in medication regimen? yes       Details: new to coumadin  Started on 5mg  qd on   Recent/future dental: no  Any missed doses?: no       Is patient compliant with meds? yes       Allergies: No Known Drug Allergies  Anticoagulation Management History:      The patient comes in today for his initial visit for anticoagulation therapy.  Positive risk factors for bleeding include an age of 41 years or older and history of CVA/TIA.  Negative risk factors for bleeding include no history of GI bleeding and absence of serious comorbidities.  The bleeding index is 'intermediate risk'.  Positive CHADS2 values include History of HTN, Age > 68 years old, and Prior Stroke/CVA/TIA.  Negative CHADS2 values include History of CHF and History of Diabetes.  Anticoagulation responsible provider: Andee Lineman MD, Michelle Piper.  INR POC: 3.9.  Cuvette Lot#: [id number].    Anticoagulation Management Assessment/Plan:      The patient's current anticoagulation dose is Warfarin sodium 5 mg tabs: take as directed per coumadin clinic.  The next INR is due 07/17/2009.  Anticoagulation instructions were given to patient.  Results were reviewed/authorized by Vashti Hey, RN.  He was notified by Vashti Hey RN.        Coagulation management information includes: not pending DCCV at this time.  Current Anticoagulation Instructions: INR 3.9 Hold coumadin tonight then decrease dose to 2.5mg  once daily   Appended Document: ccn-started on 5mg  07/10/09-lr Pt has taken coumadin today already.  Pt to hold coumadin tomorrow (wed), take 2.5mg  Thursday and recheck INR  on Friday 07/17/09.  He is to start taking coumadin at night.

## 2010-05-27 NOTE — Letter (Signed)
Summary: Engineer, materials at [facility]  [address]   [place], [place] [address]   Phone: [phone]  Fax: [phone]        July 09, 2009 MRN: [id number]   [facility] [address] [place], [place]  [address]   Dear Mr. Leever,  Your test ordered by Selena Batten has been reviewed by your physician (or physician assistant) and was found to be normal or stable. Your physician (or physician assistant) felt no changes were needed at this time.  ____ Echocardiogram  ____ Cardiac Stress Test  __X__ Lab Work  ____ Peripheral vascular study of arms, legs or neck  ____ CT scan or X-ray  ____ Lung or Breathing test  ____ Other:   Thank you.   Hoover Brunette, LPN    Duane Boston, M.D., F.A.C.C. Thressa Sheller, M.D., F.A.C.C. Oneal Grout, M.D., F.A.C.C. Cheree Ditto, M.D., F.A.C.C. Daiva Nakayama, M.D., F.A.C.C. Kenney Houseman, M.D., F.A.C.C. Jeanne Ivan, PA-C

## 2010-05-27 NOTE — Medication Information (Signed)
Summary: ccr-lr  Anticoagulant Therapy  Managed by: Vashti Hey, RN PCP: Lucianne Muss MD: Antoine Poche MD, Fayrene Fearing Indication 1: Atrial Fibrillation Lab Used: [facility] [place] Site: [place] INR POC 0.9  Dietary changes: no    Health status changes: no    Bleeding/hemorrhagic complications: no    Recent/future hospitalizations: yes       Details: Had colonoscopy with polypectomy on 12/25/09  Any changes in medication regimen? no    Recent/future dental: no  Any missed doses?: yes     Details: Has been off coumadin 12/17/09  Is scheduled to restart tonight  Is patient compliant with meds? yes       Allergies: No Known Drug Allergies  Anticoagulation Management History:      The patient is taking warfarin and comes in today for a routine follow up visit.  Positive risk factors for bleeding include an age of 75 years or older and history of CVA/TIA.  Negative risk factors for bleeding include no history of GI bleeding and absence of serious comorbidities.  The bleeding index is 'intermediate risk'.  Positive CHADS2 values include History of HTN, Age > 18 years old, and Prior Stroke/CVA/TIA.  Negative CHADS2 values include History of CHF and History of Diabetes.  Anticoagulation responsible provider: Antoine Poche MD, Fayrene Fearing.  INR POC: 0.9.  Cuvette Lot#: [id number].    Anticoagulation Management Assessment/Plan:      The patient's current anticoagulation dose is Warfarin sodium 5 mg tabs: take as directed per coumadin clinic.  The target INR is 2.0-3.0.  The next INR is due 01/12/2010.  Anticoagulation instructions were given to patient.  Results were reviewed/authorized by Vashti Hey, RN.  He was notified by Vashti Hey RN.         Prior Anticoagulation Instructions: INR 2.5 Continue coumadin 2.5mg  once daily except none on Wenesdays  Current Anticoagulation Instructions: INR 0.9 has been off coumadin since 12/17/09 for colonoscopy Restart coumadin tonight at 2.5mg  once daily  except none on Wednesdays

## 2010-05-27 NOTE — Medication Information (Signed)
Summary: ccr-lr  Anticoagulant Therapy  Managed by: Vashti Hey, RN PCP: Lucianne Muss MD: Diona Browner MD, Remi Deter Indication 1: Atrial Fibrillation Lab Used: [facility] [place] Site: [place] INR POC 2.4  Dietary changes: no    Health status changes: no    Bleeding/hemorrhagic complications: no    Recent/future hospitalizations: no    Any changes in medication regimen? no    Recent/future dental: no  Any missed doses?: no       Is patient compliant with meds? yes       Allergies: No Known Drug Allergies  Anticoagulation Management History:      The patient is taking warfarin and comes in today for a routine follow up visit.  Positive risk factors for bleeding include an age of 78 years or older and history of CVA/TIA.  Negative risk factors for bleeding include no history of GI bleeding and absence of serious comorbidities.  The bleeding index is 'intermediate risk'.  Positive CHADS2 values include History of HTN, Age > 13 years old, and Prior Stroke/CVA/TIA.  Negative CHADS2 values include History of CHF and History of Diabetes.  Anticoagulation responsible provider: Diona Browner MD, Remi Deter.  INR POC: 2.4.  Cuvette Lot#: [id number].    Anticoagulation Management Assessment/Plan:      The patient's current anticoagulation dose is Warfarin sodium 5 mg tabs: take as directed per coumadin clinic.  The next INR is due 10/06/2009.  Anticoagulation instructions were given to patient.  Results were reviewed/authorized by Vashti Hey, RN.  He was notified by Vashti Hey RN.         Prior Anticoagulation Instructions: INR 2.1 Continue coumadin 2.5mg  once daily except none on Wednesdays  Current Anticoagulation Instructions: INR 2.4 Continue coumadin 2.5mg  once daily except none on Wednesdays Prescriptions: WARFARIN SODIUM 5 MG TABS (WARFARIN SODIUM) take as directed per coumadin clinic  #30 x 2   Entered by:   Vashti Hey RN   Authorized by:   Lewayne Bunting, MD, [facility]   Signed by:    Vashti Hey RN on 09/08/2009   Method used:   Electronically to        [facility]* (retail)       [address]       [place], [place]  [address]       Ph: [phone]       Fax: [phone]   RxID:   [id number]

## 2010-05-27 NOTE — Consult Note (Signed)
Summary: NEUROLOGY CONSULT/ DR. Ninetta Lights  NEUROLOGY CONSULT/ DR. Ninetta Lights   Imported By: Zachary George 06/30/2009 09:00:35  _____________________________________________________________________  External Attachment:    Type:   Image     Comment:   External Document

## 2010-05-27 NOTE — Medication Information (Signed)
Summary: ccr-lr  Anticoagulant Therapy  Managed by: Vashti Hey, RN PCP: Lucianne Muss MD: Andee Lineman MD, Michelle Piper Indication 1: Atrial Fibrillation Lab Used: [facility] [place] Site: [place] INR POC 2.8  Dietary changes: no    Health status changes: no    Bleeding/hemorrhagic complications: no    Recent/future hospitalizations: no    Any changes in medication regimen? no    Recent/future dental: no  Any missed doses?: no       Is patient compliant with meds? yes       Allergies: No Known Drug Allergies  Anticoagulation Management History:      The patient is taking warfarin and comes in today for a routine follow up visit.  Positive risk factors for bleeding include an age of 75 years or older and history of CVA/TIA.  Negative risk factors for bleeding include no history of GI bleeding and absence of serious comorbidities.  The bleeding index is 'intermediate risk'.  Positive CHADS2 values include History of HTN, Age > 11 years old, and Prior Stroke/CVA/TIA.  Negative CHADS2 values include History of CHF and History of Diabetes.  Anticoagulation responsible provider: Andee Lineman MD, Michelle Piper.  INR POC: 2.8.  Cuvette Lot#: [id number].    Anticoagulation Management Assessment/Plan:      The patient's current anticoagulation dose is Warfarin sodium 5 mg tabs: take as directed per coumadin clinic.  The next INR is due 07/31/2009.  Anticoagulation instructions were given to patient.  Results were reviewed/authorized by Vashti Hey, RN.         Prior Anticoagulation Instructions: INR 3.0 Hold coumadin tonight then resume 2.5mg  once daily   Current Anticoagulation Instructions: INR 2.8 Decrease coumadin to 2.5mg  once daily except none on Wednesdays

## 2010-05-27 NOTE — Medication Information (Signed)
Summary: ccr-lr  Anticoagulant Therapy  Managed by: Jonathan Hey, RN PCP: Jonathan Muss MD: Jonathan Poche MD, Jonathan Daniel Indication 1: Atrial Fibrillation Lab Used: [facility] [place] Site: [place] INR POC 1.5  Dietary changes: no    Health status changes: no    Bleeding/hemorrhagic complications: no    Recent/future hospitalizations: no    Any changes in medication regimen? no    Recent/future dental: no  Any missed doses?: yes     Details: Was off coumadin 3 days for endoscopy  Is patient compliant with meds? yes       Allergies: No Known Drug Allergies  Anticoagulation Management History:      The patient is taking warfarin and comes in today for a routine follow up visit.  Positive risk factors for bleeding include an age of 65 years or older and history of CVA/TIA.  Negative risk factors for bleeding include no history of GI bleeding and absence of serious comorbidities.  The bleeding index is 'intermediate risk'.  Positive CHADS2 values include History of HTN, Age > 10 years old, and Prior Stroke/CVA/TIA.  Negative CHADS2 values include History of CHF and History of Diabetes.  Anticoagulation responsible provider: Antoine Poche MD, Jonathan Daniel.  INR POC: 1.5.  Cuvette Lot#: [id number].    Anticoagulation Management Assessment/Plan:      The patient's current anticoagulation dose is Warfarin sodium 5 mg tabs: take as directed per coumadin clinic.  The target INR is 2.0-3.0.  The next INR is due 01/22/2010.  Anticoagulation instructions were given to patient.  Results were reviewed/authorized by Jonathan Hey, RN.  He was notified by Jonathan Hey RN.         Prior Anticoagulation Instructions: INR 0.9 has been off coumadin since 12/17/09 for colonoscopy Restart coumadin tonight at 2.5mg  once daily except none on Wednesdays  Current Anticoagulation Instructions: INR 1.5 Has been off coumadin 3 days Take coumadin 5mg  tonight then resume 2.5mg  once daily except none on  Wednesdays Prescriptions: WARFARIN SODIUM 5 MG TABS (WARFARIN SODIUM) take as directed per coumadin clinic  #30 x 2   Entered by:   Jonathan Hey RN   Authorized by:   Lewayne Bunting, MD, [facility]   Signed by:   Jonathan Hey RN on 01/12/2010   Method used:   Electronically to        [facility]* (retail)       [address]       [place], [place]  [address]       Ph: [phone]       Fax: [phone]   RxID:   [id number]

## 2010-05-27 NOTE — Letter (Signed)
Summary: Internal Other/ PATIENT HISTORY FORM  Internal Other/ PATIENT HISTORY FORM   Imported By: Dorise Hiss 07/02/2009 09:08:22  _____________________________________________________________________  External Attachment:    Type:   Image     Comment:   External Document

## 2010-05-27 NOTE — Medication Information (Signed)
Summary: ccr-lr  Anticoagulant Therapy  Managed by: Vashti Hey, RN PCP: Lucianne Muss MD: Andee Lineman MD, Michelle Piper Indication 1: Atrial Fibrillation Lab Used: [facility] [place] Site: [place] INR POC 2.1  Dietary changes: no    Health status changes: no    Bleeding/hemorrhagic complications: no    Recent/future hospitalizations: no    Any changes in medication regimen? no    Recent/future dental: no  Any missed doses?: no       Is patient compliant with meds? yes       Allergies: No Known Drug Allergies  Anticoagulation Management History:      The patient is taking warfarin and comes in today for a routine follow up visit.  Positive risk factors for bleeding include an age of 75 years or older and history of CVA/TIA.  Negative risk factors for bleeding include no history of GI bleeding and absence of serious comorbidities.  The bleeding index is 'intermediate risk'.  Positive CHADS2 values include History of HTN, Age > 37 years old, and Prior Stroke/CVA/TIA.  Negative CHADS2 values include History of CHF and History of Diabetes.  Anticoagulation responsible provider: Andee Lineman MD, Michelle Piper.  INR POC: 2.1.  Cuvette Lot#: [id number].    Anticoagulation Management Assessment/Plan:      The patient's current anticoagulation dose is Warfarin sodium 5 mg tabs: take as directed per coumadin clinic.  The next INR is due 09/07/2009.  Anticoagulation instructions were given to patient.  Results were reviewed/authorized by Vashti Hey, RN.  He was notified by Vashti Hey RN.         Prior Anticoagulation Instructions: INR 2.2 Continue coumadin 2.5mg  once daily except none on Wednesdays  Current Anticoagulation Instructions: INR 2.1 Continue coumadin 2.5mg  once daily except none on Wednesdays

## 2010-06-08 ENCOUNTER — Encounter (INDEPENDENT_AMBULATORY_CARE_PROVIDER_SITE_OTHER): Payer: Self-pay | Admitting: Cardiology

## 2010-06-08 ENCOUNTER — Encounter (INDEPENDENT_AMBULATORY_CARE_PROVIDER_SITE_OTHER): Payer: Medicare Other

## 2010-06-08 DIAGNOSIS — Z7901 Long term (current) use of anticoagulants: Secondary | ICD-10-CM

## 2010-06-08 DIAGNOSIS — I4891 Unspecified atrial fibrillation: Secondary | ICD-10-CM

## 2010-06-16 NOTE — Medication Information (Signed)
Summary: ccr-lr  Anticoagulant Therapy  Managed by: Vashti Hey, RN PCP: Lucianne Muss MD: Andee Lineman MD, Michelle Piper Indication 1: Atrial Fibrillation Lab Used: [facility] [place] Site: [place] INR POC 2.5  Dietary changes: no    Health status changes: no    Bleeding/hemorrhagic complications: no    Recent/future hospitalizations: no    Any changes in medication regimen? no    Recent/future dental: no  Any missed doses?: no       Is patient compliant with meds? yes       Allergies: No Known Drug Allergies  Anticoagulation Management History:      The patient is taking warfarin and comes in today for a routine follow up visit.  Positive risk factors for bleeding include an age of 75 years or older and history of CVA/TIA.  Negative risk factors for bleeding include no history of GI bleeding and absence of serious comorbidities.  The bleeding index is 'intermediate risk'.  Positive CHADS2 values include History of HTN, Age > 75 years old, and Prior Stroke/CVA/TIA.  Negative CHADS2 values include History of CHF and History of Diabetes.  Anticoagulation responsible provider: Andee Lineman MD, Michelle Piper.  INR POC: 2.5.  Cuvette Lot#: [id number].    Anticoagulation Management Assessment/Plan:      The patient's current anticoagulation dose is Warfarin sodium 5 mg tabs: take as directed per coumadin clinic.  The target INR is 2.0-3.0.  The next INR is due 07/06/2010.  Anticoagulation instructions were given to patient.  Results were reviewed/authorized by Vashti Hey, RN.  He was notified by Vashti Hey RN.         Prior Anticoagulation Instructions: INR 2.2 Continue coumadin 2.5mg  once daily except none on Wednesdays  Current Anticoagulation Instructions: INR 2.5 Continue coumadin 2.5mg  once daily except none on Wednesdays  Appended Document: ccr-lr Sent to [facility] Imus in error.  Forwarded to Dr Andee Lineman.

## 2010-07-06 ENCOUNTER — Encounter: Payer: Self-pay | Admitting: Cardiology

## 2010-07-06 ENCOUNTER — Encounter (INDEPENDENT_AMBULATORY_CARE_PROVIDER_SITE_OTHER): Payer: Medicare Other

## 2010-07-06 DIAGNOSIS — I4891 Unspecified atrial fibrillation: Secondary | ICD-10-CM

## 2010-07-06 DIAGNOSIS — Z7901 Long term (current) use of anticoagulants: Secondary | ICD-10-CM

## 2010-07-06 LAB — CONVERTED CEMR LAB: POC INR: 2.5

## 2010-07-13 NOTE — Medication Information (Signed)
Summary: ccr-lr  Anticoagulant Therapy  Managed by: Vashti Hey, RN PCP: Lucianne Muss MD: Antoine Poche MD, Fayrene Fearing Indication 1: Atrial Fibrillation Lab Used: [facility] [place] Site: [place] INR POC 2.5  Dietary changes: no    Health status changes: no    Bleeding/hemorrhagic complications: no    Recent/future hospitalizations: no    Any changes in medication regimen? no    Recent/future dental: no  Any missed doses?: no       Is patient compliant with meds? yes       Allergies: No Known Drug Allergies  Anticoagulation Management History:      The patient is taking warfarin and comes in today for a routine follow up visit.  Positive risk factors for bleeding include an age of 31 years or older and history of CVA/TIA.  Negative risk factors for bleeding include no history of GI bleeding and absence of serious comorbidities.  The bleeding index is 'intermediate risk'.  Positive CHADS2 values include History of HTN, Age > 2 years old, and Prior Stroke/CVA/TIA.  Negative CHADS2 values include History of CHF and History of Diabetes.  Anticoagulation responsible provider: Antoine Poche MD, Fayrene Fearing.  INR POC: 2.5.  Cuvette Lot#: [id number].    Anticoagulation Management Assessment/Plan:      The patient's current anticoagulation dose is Warfarin sodium 5 mg tabs: take as directed per coumadin clinic.  The target INR is 2.0-3.0.  The next INR is due 08/03/2010.  Anticoagulation instructions were given to patient.  Results were reviewed/authorized by Vashti Hey, RN.  He was notified by Vashti Hey RN.         Prior Anticoagulation Instructions: INR 2.5 Continue coumadin 2.5mg  once daily except none on Wednesdays  Current Anticoagulation Instructions: Same as Prior Instructions. Prescriptions: WARFARIN SODIUM 5 MG TABS (WARFARIN SODIUM) take as directed per coumadin clinic  #30 x 2   Entered by:   Vashti Hey RN   Authorized by:   Lewayne Bunting, MD, [facility] [place] [facility]   Signed by:   Vashti Hey RN on  07/06/2010   Method used:   Electronically to        [facility]* (retail)       [address]       [place], [place]  [address]       Ph: [phone]       Fax: [phone]   RxID:   [id number]

## 2010-08-02 ENCOUNTER — Encounter: Payer: Self-pay | Admitting: Cardiology

## 2010-08-02 DIAGNOSIS — I4891 Unspecified atrial fibrillation: Secondary | ICD-10-CM

## 2010-08-02 DIAGNOSIS — Z7901 Long term (current) use of anticoagulants: Secondary | ICD-10-CM | POA: Insufficient documentation

## 2010-08-03 ENCOUNTER — Ambulatory Visit (INDEPENDENT_AMBULATORY_CARE_PROVIDER_SITE_OTHER): Payer: Medicare Other | Admitting: *Deleted

## 2010-08-03 DIAGNOSIS — I4891 Unspecified atrial fibrillation: Secondary | ICD-10-CM

## 2010-08-03 DIAGNOSIS — Z7901 Long term (current) use of anticoagulants: Secondary | ICD-10-CM

## 2010-08-03 LAB — POCT INR: INR: 2.1

## 2010-08-31 ENCOUNTER — Ambulatory Visit (INDEPENDENT_AMBULATORY_CARE_PROVIDER_SITE_OTHER): Payer: Medicare Other | Admitting: *Deleted

## 2010-08-31 DIAGNOSIS — Z7901 Long term (current) use of anticoagulants: Secondary | ICD-10-CM

## 2010-08-31 DIAGNOSIS — I4891 Unspecified atrial fibrillation: Secondary | ICD-10-CM

## 2010-09-27 ENCOUNTER — Other Ambulatory Visit: Payer: Self-pay | Admitting: *Deleted

## 2010-09-27 MED ORDER — CHLORTHALIDONE 25 MG PO TABS: 25.0000 mg | ORAL_TABLET | Freq: Every day | ORAL | Status: DC

## 2010-09-28 ENCOUNTER — Encounter: Payer: Medicare Other | Admitting: *Deleted

## 2010-10-01 ENCOUNTER — Ambulatory Visit (INDEPENDENT_AMBULATORY_CARE_PROVIDER_SITE_OTHER): Payer: Medicare Other | Admitting: *Deleted

## 2010-10-01 DIAGNOSIS — Z7901 Long term (current) use of anticoagulants: Secondary | ICD-10-CM

## 2010-10-01 DIAGNOSIS — I4891 Unspecified atrial fibrillation: Secondary | ICD-10-CM

## 2010-10-05 ENCOUNTER — Encounter: Payer: Self-pay | Admitting: Cardiology

## 2010-10-19 ENCOUNTER — Encounter: Payer: Medicare Other | Admitting: *Deleted

## 2010-10-20 ENCOUNTER — Ambulatory Visit (INDEPENDENT_AMBULATORY_CARE_PROVIDER_SITE_OTHER): Payer: Medicare Other | Admitting: Adult Health

## 2010-10-20 ENCOUNTER — Encounter: Payer: Self-pay | Admitting: Adult Health

## 2010-10-20 ENCOUNTER — Ambulatory Visit (INDEPENDENT_AMBULATORY_CARE_PROVIDER_SITE_OTHER): Payer: Medicare Other | Admitting: *Deleted

## 2010-10-20 DIAGNOSIS — Z7901 Long term (current) use of anticoagulants: Secondary | ICD-10-CM

## 2010-10-20 DIAGNOSIS — E782 Mixed hyperlipidemia: Secondary | ICD-10-CM

## 2010-10-20 DIAGNOSIS — I4891 Unspecified atrial fibrillation: Secondary | ICD-10-CM

## 2010-10-20 DIAGNOSIS — I10 Essential (primary) hypertension: Secondary | ICD-10-CM

## 2010-10-20 NOTE — Assessment & Plan Note (Signed)
Well controlled at present. No changes in medications at this time. 

## 2010-10-20 NOTE — Progress Notes (Signed)
HPI: [name] is a very pleasant 77 y/o CM patient of Dr. Andee Lineman in [place] we are following for continued assessment and treatment of Atrial fibrillation on coumadin therapy, with know history of Parkinson's disease.  He is without complaints of chest pain, DOE or dizziness. He complains of weakness in his legs when he walks which he attributes to his Parkinsons.  There is no pain associated. He has a cane that he uses sometimes for ambulation if he walks a lot.  Dr. Sherril Croon follows him for labs.   No Known Allergies  Current Outpatient Prescriptions  Medication Sig Dispense Refill  . acetaminophen (TYLENOL) 500 MG tablet Take 500 mg by mouth every 6 (six) hours as needed.        Marland Kitchen amantadine (SYMMETREL) 100 MG capsule Take 100 mg by mouth 2 (two) times daily.        . carbidopa-levodopa (SINEMET) 25-100 MG per tablet Take 1 tablet by mouth 4 (four) times daily.        . chlorthalidone (HYGROTON) 25 MG tablet Take 1 tablet (25 mg total) by mouth daily.  30 tablet  6  . ezetimibe-simvastatin (VYTORIN) 10-20 MG per tablet Take 1 tablet by mouth at bedtime.        . fexofenadine (ALLEGRA) 180 MG tablet Take 180 mg by mouth daily.        . Glucosamine-Chondroit-Vit C-Mn (GLUCOSAMINE 1500 COMPLEX) CAPS Take 1 capsule by mouth daily.        . Multiple Vitamins-Minerals (MULTIVITAMIN WITH MINERALS) tablet Take 1 tablet by mouth daily.        . Multiple Vitamins-Minerals (PRESERVISION/LUTEIN PO) Take 1 tablet by mouth 2 (two) times daily.        . Omega-3 Fatty Acids (OMEGA-3 FISH OIL) 1200 MG CAPS Take 1 capsule by mouth daily.        Marland Kitchen omeprazole (PRILOSEC) 20 MG capsule Take 20 mg by mouth daily.        . rasagiline (AZILECT) 0.5 MG TABS Take 0.5 mg by mouth daily.        . vitamin C (ASCORBIC ACID) 500 MG tablet Take 500 mg by mouth daily.        Marland Kitchen warfarin (COUMADIN) 5 MG tablet Take by mouth as directed.          Past Medical History  Diagnosis Date  . CVA (cerebral infarction)   . Atrial  fibrillation   . Hypertension   . Hyperlipidemia   . Parkinson's disease   . Prostate cancer   . Osteoarthritis   . DJD (degenerative joint disease), cervical     Past Surgical History  Procedure Date  . Cataract surgery   . Tonsillectomy   . Knee surgery   . Lumbar discetomy   . Carotid endarterectomy     JYN:WGNFAO of systems complete and found to be negative unless listed above PHYSICAL EXAM BP 140/75  Pulse 66  Ht 5\' 7"  (1.702 m)  Wt 159 lb (72.122 kg)  BMI 24.90 kg/m2  SpO2 93%  General: Well developed, well nourished, in no acute distress Head: Eyes PERRLA, No xanthomas.   Normal cephalic and atramatic  Lungs: Clear bilaterally to auscultation and percussion. Heart: HRRR S1 S2, 1/6 systolic murmur  Pulses are 2+ & equal.            No carotid bruit. No JVD.  No abdominal bruits. No femoral bruits. Abdomen: Bowel sounds are positive, abdomen soft and non-tender without masses or  Hernia's noted. Msk:  Back normal, normal gait. Normal strength and tone for age. Extremities: No clubbing, cyanosis or edema.  DP +1 Neuro: Alert and oriented X 3. No tremors. Psych:  Good affect, responds appropriately EAV:WUJWJ brady with PAC's. Rate of 59bpm  ASSESSMENT AND PLAN

## 2010-10-20 NOTE — Assessment & Plan Note (Signed)
Followed by Dr. Vyas 

## 2010-10-20 NOTE — Assessment & Plan Note (Signed)
Mr. Cass is doing well.  Heart rate is well controlled.  He is compliant with his medications and has no complaints of bleeding issues.  He will continue his current medications. He will follow-up with Dr. Andee Lineman in [place] in 6 months.

## 2010-10-20 NOTE — Patient Instructions (Signed)
Your physician recommends that you schedule a follow-up appointment in: 6 months  

## 2010-10-22 ENCOUNTER — Encounter: Payer: Self-pay | Admitting: Adult Health

## 2010-10-22 ENCOUNTER — Ambulatory Visit: Payer: Medicare Other | Admitting: Cardiology

## 2010-10-22 ENCOUNTER — Encounter: Payer: Medicare Other | Admitting: *Deleted

## 2010-11-16 ENCOUNTER — Ambulatory Visit (INDEPENDENT_AMBULATORY_CARE_PROVIDER_SITE_OTHER): Payer: Medicare Other | Admitting: *Deleted

## 2010-11-16 DIAGNOSIS — Z7901 Long term (current) use of anticoagulants: Secondary | ICD-10-CM

## 2010-11-16 DIAGNOSIS — I4891 Unspecified atrial fibrillation: Secondary | ICD-10-CM

## 2010-12-14 ENCOUNTER — Ambulatory Visit (INDEPENDENT_AMBULATORY_CARE_PROVIDER_SITE_OTHER): Payer: Medicare Other | Admitting: *Deleted

## 2010-12-14 DIAGNOSIS — Z7901 Long term (current) use of anticoagulants: Secondary | ICD-10-CM

## 2010-12-14 DIAGNOSIS — I4891 Unspecified atrial fibrillation: Secondary | ICD-10-CM

## 2010-12-14 MED ORDER — WARFARIN SODIUM 5 MG PO TABS: ORAL_TABLET | ORAL | Status: DC

## 2011-01-11 ENCOUNTER — Ambulatory Visit (INDEPENDENT_AMBULATORY_CARE_PROVIDER_SITE_OTHER): Payer: Medicare Other | Admitting: *Deleted

## 2011-01-11 DIAGNOSIS — Z7901 Long term (current) use of anticoagulants: Secondary | ICD-10-CM

## 2011-01-11 DIAGNOSIS — I4891 Unspecified atrial fibrillation: Secondary | ICD-10-CM

## 2011-01-11 LAB — POCT INR: INR: 2.1

## 2011-02-08 ENCOUNTER — Ambulatory Visit (INDEPENDENT_AMBULATORY_CARE_PROVIDER_SITE_OTHER): Payer: Medicare Other | Admitting: *Deleted

## 2011-02-08 DIAGNOSIS — I4891 Unspecified atrial fibrillation: Secondary | ICD-10-CM

## 2011-02-08 DIAGNOSIS — Z7901 Long term (current) use of anticoagulants: Secondary | ICD-10-CM

## 2011-02-08 LAB — POCT INR: INR: 2

## 2011-02-08 MED ORDER — WARFARIN SODIUM 5 MG PO TABS: ORAL_TABLET | ORAL | Status: DC

## 2011-03-22 ENCOUNTER — Ambulatory Visit (INDEPENDENT_AMBULATORY_CARE_PROVIDER_SITE_OTHER): Payer: Medicare Other | Admitting: *Deleted

## 2011-03-22 DIAGNOSIS — I4891 Unspecified atrial fibrillation: Secondary | ICD-10-CM

## 2011-03-22 DIAGNOSIS — Z7901 Long term (current) use of anticoagulants: Secondary | ICD-10-CM

## 2011-03-22 LAB — POCT INR: INR: 6.3

## 2011-03-25 ENCOUNTER — Ambulatory Visit (INDEPENDENT_AMBULATORY_CARE_PROVIDER_SITE_OTHER): Payer: Medicare Other | Admitting: *Deleted

## 2011-03-25 DIAGNOSIS — Z7901 Long term (current) use of anticoagulants: Secondary | ICD-10-CM

## 2011-03-25 DIAGNOSIS — I4891 Unspecified atrial fibrillation: Secondary | ICD-10-CM

## 2011-04-05 ENCOUNTER — Encounter: Payer: Medicare Other | Admitting: *Deleted

## 2011-04-08 ENCOUNTER — Ambulatory Visit (INDEPENDENT_AMBULATORY_CARE_PROVIDER_SITE_OTHER): Payer: Medicare Other | Admitting: *Deleted

## 2011-04-08 DIAGNOSIS — Z7901 Long term (current) use of anticoagulants: Secondary | ICD-10-CM

## 2011-04-08 DIAGNOSIS — I4891 Unspecified atrial fibrillation: Secondary | ICD-10-CM

## 2011-04-08 LAB — POCT INR: INR: 1.5

## 2011-04-08 MED ORDER — WARFARIN SODIUM 5 MG PO TABS: ORAL_TABLET | ORAL | Status: DC

## 2011-04-11 ENCOUNTER — Ambulatory Visit (INDEPENDENT_AMBULATORY_CARE_PROVIDER_SITE_OTHER): Payer: Medicare Other | Admitting: Cardiology

## 2011-04-11 ENCOUNTER — Encounter: Payer: Self-pay | Admitting: Cardiology

## 2011-04-11 VITALS — BP 86/56 | HR 81 | Ht 65.0 in | Wt 152.0 lb

## 2011-04-11 DIAGNOSIS — G238 Other specified degenerative diseases of basal ganglia: Secondary | ICD-10-CM

## 2011-04-11 DIAGNOSIS — I4891 Unspecified atrial fibrillation: Secondary | ICD-10-CM

## 2011-04-11 DIAGNOSIS — G903 Multi-system degeneration of the autonomic nervous system: Secondary | ICD-10-CM

## 2011-04-11 DIAGNOSIS — R110 Nausea: Secondary | ICD-10-CM | POA: Insufficient documentation

## 2011-04-11 MED ORDER — ONDANSETRON HCL 8 MG PO TABS: 8.0000 mg | ORAL_TABLET | ORAL | Status: AC

## 2011-04-11 NOTE — Assessment & Plan Note (Signed)
Patient has severe orthostatic hypotension secondary to Parkinson syndrome and associated dysautonomia. He was given a liter of intravenous fluids in the clinic with significant improvement in his symptoms. He was encouraged to drink as much Gatorade as possible. I also provided him with compression stockings. However due to was baseline hypertension I do not think Florinef or indirect vasoconstrictor are particularly good choices. Symptomatic treatment will be very limited.

## 2011-04-11 NOTE — Assessment & Plan Note (Signed)
No recurrent symptoms of atrial fibrillation. Patient remains on warfarin. Echocardiogram today shows persistent normal LV function.

## 2011-04-11 NOTE — Assessment & Plan Note (Signed)
This could be part of the patient's Parkinson's syndrome GI symptom complex. He is receiving a GI workup but I doubt this will be very revealing. These symptoms also fit with his orthostatic hypotension. In part of his nausea may be secondary to significant drop in the morning and blood pressure. I've given the patient a prescription of Zofran that he can take in the morning.

## 2011-04-11 NOTE — Progress Notes (Signed)
Jonathan Bottoms, MD, [facility] Board Certified in Adult Cardiovascular Medicine,Internal Medicine and Critical Care Medicine    CC: Profound weakness and dizziness  HPI:  The patient is a 77 year old male with severe Parkinson's disease. He has a history of atrial fibrillation which is paroxysmal and is on chronic Coumadin therapy. He reports no falls but states that in the last few days to weeks she's had no energy. When he tries to eat something he becomes nauseated and doesn't want to be. He stays by just looking and thinking about food he feels like throwing up and becomes more nauseated. He reports the symptoms almost daily. He has been evaluated for his gallbladder and tomorrow is undergoing a CT scan of chest and abdomen. He does not report any shortness of breath or chest pain, just profound weakness. He does state that in the morning when he gets up he feels very nauseated and very dizzy he does report postural changes. We obtained orthostatic blood pressures in the clinic today and is blood pressure dropped from 160 systolic to only 80 mmHg diastolic. After this he was given a liter of normal saline. He reports no other cardiovascular symptoms.     PMH: reviewed and listed in Problem List in Electronic Records (and see below) Past Medical History  Diagnosis Date  . CVA (cerebral infarction)   . Atrial fibrillation   . Hypertension   . Hyperlipidemia   . Parkinson's disease   . Prostate cancer   . Osteoarthritis   . DJD (degenerative joint disease), cervical    Past Surgical History  Procedure Date  . Cataract surgery   . Tonsillectomy   . Knee surgery   . Lumbar discetomy   . Carotid endarterectomy       Allergies/SH/FHX : available in Electronic Records for review  Medications: Current Outpatient Prescriptions  Medication Sig Dispense Refill  . acetaminophen (TYLENOL) 500 MG tablet Take 500 mg by mouth every 6 (six) hours as needed.        Marland Kitchen amantadine  (SYMMETREL) 100 MG capsule Take 100 mg by mouth 2 (two) times daily.        . carbidopa-levodopa (SINEMET) 25-100 MG per tablet Take 1 tablet by mouth 4 (four) times daily.        . chlorthalidone (HYGROTON) 25 MG tablet Take 1 tablet (25 mg total) by mouth daily.  30 tablet  6  . esomeprazole (NEXIUM) 20 MG capsule Take 20 mg by mouth 2 (two) times daily.        . fexofenadine (ALLEGRA) 180 MG tablet Take 180 mg by mouth daily.        . Glucosamine-Chondroit-Vit C-Mn (GLUCOSAMINE 1500 COMPLEX) CAPS Take 1 capsule by mouth daily.        . Multiple Vitamins-Minerals (MULTIVITAMIN WITH MINERALS) tablet Take 1 tablet by mouth daily.        . Multiple Vitamins-Minerals (PRESERVISION/LUTEIN PO) Take 1 tablet by mouth daily.       . Omega-3 Fatty Acids (OMEGA-3 FISH OIL) 1200 MG CAPS Take 1 capsule by mouth daily.        . rasagiline (AZILECT) 0.5 MG TABS Take 0.5 mg by mouth daily.        . vitamin C (ASCORBIC ACID) 500 MG tablet Take 500 mg by mouth daily.        Marland Kitchen warfarin (COUMADIN) 5 MG tablet Take 1/2 tablet daily or as directed  30 tablet  3  . ondansetron (ZOFRAN) 8 MG  tablet Take 1 tablet (8 mg total) by mouth every morning. As needed before breakfast.  30 tablet  0    ROS: No nausea or vomiting. No fever or chills.No melena or hematochezia.No bleeding.No claudication  Physical Exam: BP 86/56  Pulse 81  Ht 5\' 5"  (1.651 m)  Wt 152 lb (68.947 kg)  BMI 25.29 kg/m2 General: Well-nourished white male in no apparent distress Neck: Normal carotid upstroke and no carotid bruits. No thyromegaly nonnodular thyroid. JVP is only 5 cm Lungs: Clear breath sounds bilaterally without wheezing Cardiac: Regular rate and rhythm with normal S1-S2 and no murmur rubs or gallops Vascular: No edema. Normal distal pulses Skin: Warm and dry Neurologic: Shuffling gait and tremor  12lead ECG: Normal sinus rhythm with no acute changes Limited bedside ECHO: Normal LV systolic function with no gross valvular  abnormalities  Assessment and Plan  Counseling was provided regarding the current medical condition and included: . Diagnosis, impressions, prognosis, recommended diagnostic studies  . Risks and benefits of treatment options  . Instructions for management, treatment and/or follow-up care  . Importance of compliance with treatment, risk factor reduction  . Patient and/or family education    Time spent counseling was 45 minutes and recorded in the Problem List.

## 2011-04-11 NOTE — Patient Instructions (Addendum)
   Zofran 8mg  every morning as need for nausea before breakfast   Compression stockings   Drink lots of gatorade - G2 Your physician wants you to follow up in: 6 months.  You will receive a reminder letter in the mail one-two months in advance.  If you don't receive a letter, please call our office to schedule the follow up appointment

## 2011-04-22 ENCOUNTER — Ambulatory Visit (INDEPENDENT_AMBULATORY_CARE_PROVIDER_SITE_OTHER): Payer: Medicare Other | Admitting: *Deleted

## 2011-04-22 DIAGNOSIS — I4891 Unspecified atrial fibrillation: Secondary | ICD-10-CM

## 2011-04-22 DIAGNOSIS — Z7901 Long term (current) use of anticoagulants: Secondary | ICD-10-CM

## 2011-04-22 LAB — POCT INR: INR: 1.9

## 2011-05-06 ENCOUNTER — Ambulatory Visit (INDEPENDENT_AMBULATORY_CARE_PROVIDER_SITE_OTHER): Payer: Medicare Other | Admitting: *Deleted

## 2011-05-06 DIAGNOSIS — I4891 Unspecified atrial fibrillation: Secondary | ICD-10-CM

## 2011-05-06 DIAGNOSIS — Z7901 Long term (current) use of anticoagulants: Secondary | ICD-10-CM

## 2011-05-13 ENCOUNTER — Ambulatory Visit (INDEPENDENT_AMBULATORY_CARE_PROVIDER_SITE_OTHER): Payer: Medicare Other | Admitting: *Deleted

## 2011-05-13 DIAGNOSIS — Z7901 Long term (current) use of anticoagulants: Secondary | ICD-10-CM

## 2011-05-13 DIAGNOSIS — I4891 Unspecified atrial fibrillation: Secondary | ICD-10-CM

## 2011-05-31 ENCOUNTER — Ambulatory Visit (INDEPENDENT_AMBULATORY_CARE_PROVIDER_SITE_OTHER): Payer: Medicare Other | Admitting: *Deleted

## 2011-05-31 DIAGNOSIS — Z7901 Long term (current) use of anticoagulants: Secondary | ICD-10-CM

## 2011-05-31 DIAGNOSIS — I4891 Unspecified atrial fibrillation: Secondary | ICD-10-CM

## 2011-06-21 ENCOUNTER — Ambulatory Visit (INDEPENDENT_AMBULATORY_CARE_PROVIDER_SITE_OTHER): Payer: Medicare Other | Admitting: *Deleted

## 2011-06-21 DIAGNOSIS — I4891 Unspecified atrial fibrillation: Secondary | ICD-10-CM

## 2011-06-21 DIAGNOSIS — Z7901 Long term (current) use of anticoagulants: Secondary | ICD-10-CM

## 2011-06-21 LAB — POCT INR: INR: 4.4

## 2011-07-12 ENCOUNTER — Ambulatory Visit (INDEPENDENT_AMBULATORY_CARE_PROVIDER_SITE_OTHER): Payer: Medicare Other | Admitting: *Deleted

## 2011-07-12 DIAGNOSIS — Z7901 Long term (current) use of anticoagulants: Secondary | ICD-10-CM

## 2011-07-12 DIAGNOSIS — I4891 Unspecified atrial fibrillation: Secondary | ICD-10-CM

## 2011-08-02 ENCOUNTER — Ambulatory Visit (INDEPENDENT_AMBULATORY_CARE_PROVIDER_SITE_OTHER): Payer: Medicare Other | Admitting: *Deleted

## 2011-08-02 DIAGNOSIS — Z7901 Long term (current) use of anticoagulants: Secondary | ICD-10-CM

## 2011-08-02 DIAGNOSIS — I4891 Unspecified atrial fibrillation: Secondary | ICD-10-CM

## 2011-08-02 LAB — POCT INR: INR: 1.6

## 2011-08-19 ENCOUNTER — Ambulatory Visit (INDEPENDENT_AMBULATORY_CARE_PROVIDER_SITE_OTHER): Payer: Medicare Other | Admitting: *Deleted

## 2011-08-19 DIAGNOSIS — Z7901 Long term (current) use of anticoagulants: Secondary | ICD-10-CM

## 2011-08-19 DIAGNOSIS — I4891 Unspecified atrial fibrillation: Secondary | ICD-10-CM

## 2011-08-19 LAB — POCT INR: INR: 2.1

## 2011-09-09 ENCOUNTER — Ambulatory Visit (INDEPENDENT_AMBULATORY_CARE_PROVIDER_SITE_OTHER): Payer: Medicare Other | Admitting: *Deleted

## 2011-09-09 DIAGNOSIS — I4891 Unspecified atrial fibrillation: Secondary | ICD-10-CM

## 2011-09-09 DIAGNOSIS — Z7901 Long term (current) use of anticoagulants: Secondary | ICD-10-CM

## 2011-09-09 LAB — POCT INR: INR: 1.9

## 2011-10-14 ENCOUNTER — Ambulatory Visit (INDEPENDENT_AMBULATORY_CARE_PROVIDER_SITE_OTHER): Payer: Medicare Other | Admitting: *Deleted

## 2011-10-14 DIAGNOSIS — I4891 Unspecified atrial fibrillation: Secondary | ICD-10-CM

## 2011-10-14 DIAGNOSIS — Z7901 Long term (current) use of anticoagulants: Secondary | ICD-10-CM

## 2011-10-24 ENCOUNTER — Encounter: Payer: Self-pay | Admitting: Cardiology

## 2011-10-24 ENCOUNTER — Ambulatory Visit (INDEPENDENT_AMBULATORY_CARE_PROVIDER_SITE_OTHER): Payer: Medicare Other | Admitting: Cardiology

## 2011-10-24 VITALS — BP 157/81 | HR 54 | Ht 66.0 in | Wt 165.8 lb

## 2011-10-24 DIAGNOSIS — I951 Orthostatic hypotension: Secondary | ICD-10-CM

## 2011-10-24 DIAGNOSIS — G238 Other specified degenerative diseases of basal ganglia: Secondary | ICD-10-CM

## 2011-10-24 DIAGNOSIS — R110 Nausea: Secondary | ICD-10-CM

## 2011-10-24 DIAGNOSIS — G903 Multi-system degeneration of the autonomic nervous system: Secondary | ICD-10-CM

## 2011-10-24 DIAGNOSIS — Z7901 Long term (current) use of anticoagulants: Secondary | ICD-10-CM

## 2011-10-24 DIAGNOSIS — I2510 Atherosclerotic heart disease of native coronary artery without angina pectoris: Secondary | ICD-10-CM

## 2011-10-24 NOTE — Assessment & Plan Note (Signed)
Patient remains in normal sinus rhythm. The EKG reviewed continue current medical regimen

## 2011-10-24 NOTE — Assessment & Plan Note (Signed)
Improved when he takes his medications would buttermilk.

## 2011-10-24 NOTE — Assessment & Plan Note (Signed)
No recurrent chest pain. No change in medical therapy

## 2011-10-24 NOTE — Assessment & Plan Note (Signed)
Patient awoke with Coumadin no complications despite his Parkinson's disease.

## 2011-10-24 NOTE — Progress Notes (Signed)
Jonathan Bottoms, MD, [facility] Board Certified in Adult Cardiovascular Medicine,Internal Medicine and Critical Care Medicine    CC:     Followup patient with a history of atrial fibrillation.                                                                             HPI:        Patient remains in normal sinus rhythm. He reports no palpitations. The patient has worsening Parkinson's disease and is considering deep brain stimulation at [facility]. He has difficulty putting on his compression stockings. He also has a lot of back pain. He still able to do his ADLs however. He has no angina but has some shortness of breath on moderate exertion which is chronic. Due to his Parkinson's disease he can only do his activities slowly. The patient reports no syncope or presyncope.  PMH: reviewed and listed in Problem List in Electronic Records (and see below) Past Medical History  Diagnosis Date  . CVA (cerebral infarction)   . Atrial fibrillation   . Hypertension   . Hyperlipidemia   . Parkinson's disease   . Prostate cancer   . Osteoarthritis   . DJD (degenerative joint disease), cervical    Past Surgical History  Procedure Date  . Cataract surgery   . Tonsillectomy   . Knee surgery   . Lumbar discetomy   . Carotid endarterectomy     Allergies/SH/FHX : available in Electronic Records for review  No Known Allergies History   Social History  . Marital Status: Married    Spouse Name: N/A    Number of Children: N/A  . Years of Education: N/A   Occupational History  . Not on file.   Social History Main Topics  . Smoking status: Former Smoker -- 1.0 packs/day for 15 years    Types: Cigarettes    Quit date: 04/26/1955  . Smokeless tobacco: Never Used  . Alcohol Use: No  . Drug Use: No  . Sexually Active: Yes   Other Topics Concern  . Not on file   Social History Narrative  . No narrative on file   Family History  Problem Relation Age of Onset  . Heart  disease Father     Medications: Current Outpatient Prescriptions  Medication Sig Dispense Refill  . acetaminophen (TYLENOL) 500 MG tablet Take 500 mg by mouth every 6 (six) hours as needed.        Marland Kitchen amantadine (SYMMETREL) 100 MG capsule Take 100 mg by mouth 2 (two) times daily.        . carbidopa-levodopa (SINEMET) 25-100 MG per tablet Take 1 tablet by mouth 4 (four) times daily.        Marland Kitchen esomeprazole (NEXIUM) 20 MG capsule Take 20 mg by mouth 2 (two) times daily.        . Glucosamine-Chondroit-Vit C-Mn (GLUCOSAMINE 1500 COMPLEX) CAPS Take 1 capsule by mouth daily.        . Multiple Vitamins-Minerals (MULTIVITAMIN WITH MINERALS) tablet Take 1 tablet by mouth daily.        . Multiple Vitamins-Minerals (PRESERVISION/LUTEIN PO) Take 1 tablet by mouth daily.       . Omega-3  Fatty Acids (OMEGA-3 FISH OIL) 1200 MG CAPS Take 1 capsule by mouth daily.        . rasagiline (AZILECT) 0.5 MG TABS Take 0.5 mg by mouth daily.        . vitamin C (ASCORBIC ACID) 500 MG tablet Take 500 mg by mouth daily.        Marland Kitchen warfarin (COUMADIN) 5 MG tablet Take 1/2 tablet daily or as directed  30 tablet  3    ROS: No nausea or vomiting. No fever or chills.No melena or hematochezia.No bleeding.No claudication  Physical Exam: BP 157/81  Pulse 54  Ht 5\' 6"  (1.676 m)  Wt 165 lb 12.8 oz (75.206 kg)  BMI 26.76 kg/m2 General: Well-nourished white male with parkinsonian features. Neck: Normal carotid upstroke no carotid bruits. No thyromegaly nonnodular thyroid. JVP is 8 cm Lungs: Clear breath sounds bilaterally no wheezing. Cardiac: Regular rate and rhythm with normal S1-S2 no murmur rubs or gallops. Vascular: No edema. Normal distal pulses bilaterally Skin: Warm and dry Physcologic: Normal affect  12lead ECG: Sinus bradycardia with PACs. Otherwise normal tracing Limited bedside ECHO:N/A No images are attached to the encounter.   I reviewed and summarized the old records. I reviewed ECG and prior blood  work.  Assessment and Plan  ATRIAL FIBRILLATION Patient remains in normal sinus rhythm. The EKG reviewed continue current medical regimen  CORONARY ATHEROSCLEROSIS NATIVE CORONARY ARTERY No recurrent chest pain. No change in medical therapy  Encounter for long-term (current) use of anticoagulants Patient awoke with Coumadin no complications despite his Parkinson's disease.  Dysautonomia orthostatic hypotension syndrome Secondary to Parkinson's disease. Patient has difficulty getting his compression stockings on. He lives by himself.  Nausea Improved when he takes his medications would buttermilk.    Patient Active Problem List  Diagnosis  . MIXED HYPERLIPIDEMIA  . HYPOPOTASSEMIA  . OTHER SPECIFIED VISUAL DISTURBANCES  . ESSENTIAL HYPERTENSION, BENIGN  . CORONARY ATHEROSCLEROSIS NATIVE CORONARY ARTERY  . ATRIAL FIBRILLATION  . OCCLUSION&STENOS CAROTID ART W/O MENTION INFARCT  . CHANGE IN BOWELS  . Encounter for long-term (current) use of anticoagulants  . Dysautonomia orthostatic hypotension syndrome  . Nausea

## 2011-10-24 NOTE — Assessment & Plan Note (Signed)
Secondary to Parkinson's disease. Patient has difficulty getting his compression stockings on. He lives by himself.

## 2011-10-24 NOTE — Patient Instructions (Addendum)
Continue all current medications. Your physician wants you to follow up in: 6 months.  You will receive a reminder letter in the mail one-two months in advance.  If you don't receive a letter, please call our office to schedule the follow up appointment   

## 2011-11-04 ENCOUNTER — Ambulatory Visit (INDEPENDENT_AMBULATORY_CARE_PROVIDER_SITE_OTHER): Payer: Medicare Other | Admitting: *Deleted

## 2011-11-04 DIAGNOSIS — Z7901 Long term (current) use of anticoagulants: Secondary | ICD-10-CM

## 2011-11-04 DIAGNOSIS — I4891 Unspecified atrial fibrillation: Secondary | ICD-10-CM

## 2011-11-29 ENCOUNTER — Ambulatory Visit (INDEPENDENT_AMBULATORY_CARE_PROVIDER_SITE_OTHER): Payer: Medicare Other | Admitting: *Deleted

## 2011-11-29 DIAGNOSIS — I4891 Unspecified atrial fibrillation: Secondary | ICD-10-CM

## 2011-11-29 DIAGNOSIS — Z7901 Long term (current) use of anticoagulants: Secondary | ICD-10-CM

## 2011-11-29 MED ORDER — WARFARIN SODIUM 5 MG PO TABS: ORAL_TABLET | ORAL | Status: AC

## 2011-12-13 ENCOUNTER — Telehealth: Payer: Self-pay | Admitting: *Deleted

## 2011-12-13 NOTE — Telephone Encounter (Signed)
Pt had been in hospital at [facility] for HTN and missed some coumadin doses there and has not taken any at home in 3 days.  "Needs to get back on track".  Told daughter to have pt restart regular dose of coumadin and appt made for INR check on 12/20/11.  Daughter in agreement.

## 2011-12-20 ENCOUNTER — Ambulatory Visit (INDEPENDENT_AMBULATORY_CARE_PROVIDER_SITE_OTHER): Payer: Medicare Other | Admitting: *Deleted

## 2011-12-20 DIAGNOSIS — Z7901 Long term (current) use of anticoagulants: Secondary | ICD-10-CM

## 2011-12-20 DIAGNOSIS — I4891 Unspecified atrial fibrillation: Secondary | ICD-10-CM

## 2011-12-20 LAB — POCT INR: INR: 1.7

## 2012-01-10 ENCOUNTER — Ambulatory Visit (INDEPENDENT_AMBULATORY_CARE_PROVIDER_SITE_OTHER): Payer: Medicare Other | Admitting: *Deleted

## 2012-01-10 DIAGNOSIS — I4891 Unspecified atrial fibrillation: Secondary | ICD-10-CM

## 2012-01-10 DIAGNOSIS — Z7901 Long term (current) use of anticoagulants: Secondary | ICD-10-CM

## 2012-02-07 ENCOUNTER — Ambulatory Visit (INDEPENDENT_AMBULATORY_CARE_PROVIDER_SITE_OTHER): Payer: Medicare Other | Admitting: *Deleted

## 2012-02-07 DIAGNOSIS — I4891 Unspecified atrial fibrillation: Secondary | ICD-10-CM

## 2012-02-07 DIAGNOSIS — Z7901 Long term (current) use of anticoagulants: Secondary | ICD-10-CM

## 2015-07-29 ENCOUNTER — Encounter (HOSPITAL_COMMUNITY): Payer: Self-pay | Admitting: Emergency Medicine

## 2015-07-29 ENCOUNTER — Emergency Department (HOSPITAL_COMMUNITY): Payer: Medicare HMO

## 2015-07-29 ENCOUNTER — Emergency Department (HOSPITAL_COMMUNITY)
Admission: EM | Admit: 2015-07-29 | Discharge: 2015-07-29 | Disposition: A | Discharge status: Home / Self Care | Payer: Medicare HMO | Attending: Emergency Medicine | Admitting: Emergency Medicine

## 2015-07-29 DIAGNOSIS — G20 Parkinson's disease: Secondary | ICD-10-CM | POA: Insufficient documentation

## 2015-07-29 DIAGNOSIS — I10 Essential (primary) hypertension: Secondary | ICD-10-CM | POA: Insufficient documentation

## 2015-07-29 DIAGNOSIS — Z87891 Personal history of nicotine dependence: Secondary | ICD-10-CM | POA: Diagnosis not present

## 2015-07-29 DIAGNOSIS — Y999 Unspecified external cause status: Secondary | ICD-10-CM | POA: Diagnosis not present

## 2015-07-29 DIAGNOSIS — Z8546 Personal history of malignant neoplasm of prostate: Secondary | ICD-10-CM | POA: Insufficient documentation

## 2015-07-29 DIAGNOSIS — W19XXXA Unspecified fall, initial encounter: Secondary | ICD-10-CM

## 2015-07-29 DIAGNOSIS — Y92009 Unspecified place in unspecified non-institutional (private) residence as the place of occurrence of the external cause: Secondary | ICD-10-CM | POA: Diagnosis not present

## 2015-07-29 DIAGNOSIS — Z79899 Other long term (current) drug therapy: Secondary | ICD-10-CM | POA: Insufficient documentation

## 2015-07-29 DIAGNOSIS — Y939 Activity, unspecified: Secondary | ICD-10-CM | POA: Insufficient documentation

## 2015-07-29 DIAGNOSIS — S0101XA Laceration without foreign body of scalp, initial encounter: Secondary | ICD-10-CM | POA: Insufficient documentation

## 2015-07-29 DIAGNOSIS — W01198A Fall on same level from slipping, tripping and stumbling with subsequent striking against other object, initial encounter: Secondary | ICD-10-CM | POA: Diagnosis not present

## 2015-07-29 DIAGNOSIS — E785 Hyperlipidemia, unspecified: Secondary | ICD-10-CM | POA: Insufficient documentation

## 2015-07-29 DIAGNOSIS — I639 Cerebral infarction, unspecified: Secondary | ICD-10-CM | POA: Diagnosis not present

## 2015-07-29 DIAGNOSIS — I4891 Unspecified atrial fibrillation: Secondary | ICD-10-CM | POA: Diagnosis not present

## 2015-07-29 LAB — PROTIME-INR
INR: 1.07 (ref 0.00–1.49)
Prothrombin Time: 14.1 seconds (ref 11.6–15.2)

## 2015-07-29 MED ORDER — LIDOCAINE-EPINEPHRINE (PF) 2 %-1:200000 IJ SOLN
20.0000 mL | Freq: Once | INTRAMUSCULAR | Status: AC
  Administered 2015-07-29: 20 mL
  Filled 2015-07-29: qty 20

## 2015-07-29 NOTE — ED Provider Notes (Signed)
CSN: [id number]     Arrival date & time 07/29/15  1509 History   First MD Initiated Contact with Patient 07/29/15 1917     Chief Complaint  Patient presents with  . Head Laceration     (Consider location/radiation/quality/duration/timing/severity/associated sxs/prior Treatment) HPI 82 year old male who presents with a head laceration after mechanical fall. History of atrial fibrillation, no longer taking Coumadin. Also history of CVA and Parkinson's disease. Have gait instability with his Parkinson's and lost balance today and hit head against the edge of a wall. Fallen onto his left side, but did not have any loss of consciousness. Sustained a laceration to the head. No headaches vision changes, speech changes, focal numbness or weakness, abdominal pain, chest pain, back pain or neck pain. Does have some mild tenderness of the left buttock where he had fallen. Past Medical History  Diagnosis Date  . CVA (cerebral infarction)   . Atrial fibrillation ([place])   . Hypertension   . Hyperlipidemia   . Parkinson's disease   . Prostate cancer ([place])   . Osteoarthritis   . DJD (degenerative joint disease), cervical    Past Surgical History  Procedure Laterality Date  . Cataract surgery    . Tonsillectomy    . Knee surgery    . Lumbar discetomy    . Carotid endarterectomy     Family History  Problem Relation Age of Onset  . Heart disease Father    Social History  Substance Use Topics  . Smoking status: Former Smoker -- 1.00 packs/day for 15 years    Types: Cigarettes    Quit date: 04/26/1955  . Smokeless tobacco: Never Used  . Alcohol Use: No    Review of Systems 10/14 systems reviewed and are negative other than those stated in the HPI    Allergies  Bee venom  Home Medications   Prior to Admission medications   Medication Sig Start Date End Date Taking? Authorizing Provider  amantadine (SYMMETREL) 100 MG capsule Take 100 mg by mouth 2 (two) times daily.     Yes Historical  Provider, MD  amLODipine (NORVASC) 5 MG tablet Take 5 mg by mouth daily.   Yes Historical Provider, MD  carbidopa-levodopa (SINEMET CR) 50-200 MG tablet Take 1 tablet by mouth at bedtime.   Yes Historical Provider, MD  carbidopa-levodopa (SINEMET) 25-100 MG per tablet Take 1 tablet by mouth 4 (four) times daily.     Yes Historical Provider, MD  esomeprazole (NEXIUM) 20 MG capsule Take 20 mg by mouth 2 (two) times daily.     Yes Historical Provider, MD  furosemide (LASIX) 20 MG tablet Take 40 mg by mouth daily.   Yes Historical Provider, MD  Glucosamine-Chondroit-Vit C-Mn (GLUCOSAMINE 1500 COMPLEX) CAPS Take 1 capsule by mouth daily.     Yes Historical Provider, MD  isosorbide mononitrate (IMDUR) 30 MG 24 hr tablet Take 30 mg by mouth daily.   Yes Historical Provider, MD  Multiple Vitamins-Minerals (MULTIVITAMIN WITH MINERALS) tablet Take 1 tablet by mouth daily.     Yes Historical Provider, MD  Multiple Vitamins-Minerals (PRESERVISION/LUTEIN PO) Take 1 tablet by mouth daily.    Yes Historical Provider, MD  Omega-3 Fatty Acids (OMEGA-3 FISH OIL) 1200 MG CAPS Take 1 capsule by mouth daily.     Yes Historical Provider, MD  pantoprazole (PROTONIX) 40 MG tablet Take 40 mg by mouth daily.   Yes Historical Provider, MD  potassium chloride (K-DUR,KLOR-CON) 10 MEQ tablet Take 10 mEq by mouth daily.   Yes  Historical Provider, MD  pravastatin (PRAVACHOL) 20 MG tablet Take 20 mg by mouth at bedtime.   Yes Historical Provider, MD  rivastigmine (EXELON) 4.6 mg/24hr Place 4.6 mg onto the skin daily.   Yes Historical Provider, MD  tamsulosin (FLOMAX) 0.4 MG CAPS capsule Take 0.4 mg by mouth 2 (two) times daily.   Yes Historical Provider, MD  vitamin C (ASCORBIC ACID) 500 MG tablet Take 500 mg by mouth daily.     Yes Historical Provider, MD  acetaminophen (TYLENOL) 500 MG tablet Take 500 mg by mouth every 6 (six) hours as needed for mild pain.     Historical Provider, MD  warfarin (COUMADIN) 5 MG tablet Take 1/2  tablet daily or as directed Patient not taking: Reported on 07/29/2015 11/29/11   Ezra Sites, MD   BP 153/78 mmHg  Pulse 54  Temp(Src) 98.1 F (36.7 C) (Oral)  Resp 16  Ht 5\' 6"  (1.676 m)  Wt 172 lb (78.019 kg)  BMI 27.77 kg/m2  SpO2 98% Physical Exam Physical Exam  Nursing note and vitals reviewed. Constitutional: Well developed, well nourished, non-toxic, and in no acute distress Head: Normocephalic. 3 cm posterior left scalp laceration. Bleeding controlled. Intact sensation surrounding. Mouth/Throat: Oropharynx is clear and moist.  Neck: Normal range of motion. Neck supple.  no cervical spine tenderness. Cardiovascular: Normal rate and regular rhythm.   Pulmonary/Chest: Effort normal and breath sounds normal.  Abdominal: Soft. There is no tenderness. There is no rebound and no guarding.  Musculoskeletal: Normal range of motion of all 4 extremities. Reported pain of the left buttock with range of motion of the left hip. No deformities or acute bruising. No TLS spine tenderness or deformities..  Neurological: Alert, no facial droop, fluent speech, moves all extremities symmetrically, sensation to light touch intact throughout Skin: Skin is warm and dry.  Psychiatric: Cooperative  ED Course  .Marland KitchenLaceration Repair Date/Time: 07/30/2015 12:12 AM Performed by: Brantley Stage DUO Authorized by: Brantley Stage DUO Consent: Verbal consent obtained. Risks and benefits: risks, benefits and alternatives were discussed Consent given by: patient Patient understanding: patient states understanding of the procedure being performed Patient identity confirmed: verbally with patient Time out: Immediately prior to procedure a "time out" was called to verify the correct patient, procedure, equipment, support staff and site/side marked as required. Body area: head/neck Location details: scalp Laceration length: 3 cm Foreign bodies: no foreign bodies Tendon involvement: none Nerve involvement: none Vascular  damage: no Anesthesia: local infiltration Local anesthetic: lidocaine 2% with epinephrine Anesthetic total: 8 ml Patient sedated: no Preparation: Patient was prepped and draped in the usual sterile fashion. Irrigation solution: tap water Amount of cleaning: standard Debridement: none Degree of undermining: none Skin closure: staples Number of sutures: 4 Technique: simple Approximation: close Approximation difficulty: simple Patient tolerance: Patient tolerated the procedure well with no immediate complications   (including critical care time) Labs Review Labs Reviewed  PROTIME-INR    Imaging Review Ct Head Wo Contrast  07/29/2015  CLINICAL DATA:  82 year old male who tripped and fell at home with large laceration. Parkinson. Initial encounter. EXAM: CT HEAD WITHOUT CONTRAST TECHNIQUE: Contiguous axial images were obtained from the base of the skull through the vertex without intravenous contrast. COMPARISON:  None. FINDINGS: Superior left lateral scalp soft tissue injury (series 3, image 49) with no confluent scalp hematoma. Underlying calvarium intact. No other acute scalp or orbits soft tissue finding. Osteopenia. No acute osseous abnormality identified. Multifocal polypoid mucosal thickening in the visible paranasal sinuses.  Mastoids and tympanic cavities are clear. Calcified atherosclerosis at the skull base. Generalized cerebral volume loss. Mild probable ex vacuo enlargement at the occipital horns. Dural calcifications. No acute intracranial hemorrhage identified. Patchy bilateral white matter hypodensity. No cortically based acute infarct identified. IMPRESSION: 1. Scalp soft tissue injury without underlying fracture. 2. No acute intracranial abnormality. Mild to moderate for age cerebral volume loss and nonspecific white matter changes. Electronically Signed   By: Genevie Ann M.D.   On: 07/29/2015 17:14   Ct Cervical Spine Wo Contrast  07/29/2015  CLINICAL DATA:  Fall in core of house.  Parkinson's disease. Neck pain. Initial encounter. EXAM: CT CERVICAL SPINE WITHOUT CONTRAST TECHNIQUE: Multidetector CT imaging of the cervical spine was performed without intravenous contrast. Multiplanar CT image reconstructions were also generated. COMPARISON:  None. FINDINGS: No evidence of acute fracture, subluxation, or prevertebral soft tissue swelling. Severe degenerative disc disease is seen from levels of C3 to T1 mild bilateral facet DJD is seen at several cervical levels. No focal lytic or sclerotic bone lesions identified. IMPRESSION: No evidence of acute cervical spine fracture or subluxation. Degenerative spondylosis, as described above. Electronically Signed   By: Earle Gell M.D.   On: 07/29/2015 20:21   Dg Hip Unilat With Pelvis 2-3 Views Left  07/29/2015  CLINICAL DATA:  Left hip pain and left lower back pain since a fall at home today. EXAM: DG HIP (WITH OR WITHOUT PELVIS) 2-3V LEFT COMPARISON:  None. FINDINGS: There is no evidence of hip fracture or dislocation. There is no evidence of arthropathy or other focal bone abnormality of the hips or pelvic bones. Degenerative disc disease at L4-5. IMPRESSION: No acute abnormalities. Electronically Signed   By: Lorriane Shire M.D.   On: 07/29/2015 19:57   I have personally reviewed and evaluated these images and lab results as part of my medical decision-making.   EKG Interpretation None      MDM   Final diagnoses:  Scalp laceration, initial encounter  Fall, initial encounter    82 year old male with history of Parkinson's disease who presents with head laceration after mechanical fall. Behaving at baseline on presentation with stable vital signs. He is well-appearing in no acute distress. With 3 cm scalp laceration, bleeding well controlled. No other acute injuries noted on exam. CT of the head and cervical spine with x-rays of the left hip and pelvis was performed. This shows no evidence of acute traumatic injury. Laceration  repaired with staples. Wound care instructions are reviewed as well as instructions regarding staple removal. Strict return and follow-up instructions are reviewed. Patient and family expressed understanding of all discharge instructions and felt comfortable to plan of care.    Forde Dandy, MD 07/30/15 [phone]

## 2015-07-29 NOTE — Discharge Instructions (Signed)
You have 4 staples in your head. Please have them removed by your PCP in 7-10 days. Watch out for signs of infection including fever, increased redness or swelling, pus drainage. Return without fail for any signs of infection or any other worsening symptoms such as confusion, vomiting unable to keep down fluids, inability to walk, or any other symptoms concerning to you.  Laceration Care, Adult A laceration is a cut that goes through all of the layers of the skin and into the tissue that is right under the skin. Some lacerations heal on their own. Others need to be closed with stitches (sutures), staples, skin adhesive strips, or skin glue. Proper laceration care minimizes the risk of infection and helps the laceration to heal better. HOW TO CARE FOR YOUR LACERATION If sutures or staples were used:  Keep the wound clean and dry.  If you were given a bandage (dressing), you should change it at least one time per day or as told by your health care provider. You should also change it if it becomes wet or dirty.  Keep the wound completely dry for the first 24 hours or as told by your health care provider. After that time, you may shower or bathe. However, make sure that the wound is not soaked in water until after the sutures or staples have been removed.  Clean the wound one time each day or as told by your health care provider:  Wash the wound with soap and water.  Rinse the wound with water to remove all soap.  Pat the wound dry with a clean towel. Do not rub the wound.  After cleaning the wound, apply a thin layer of antibiotic ointmentas told by your health care provider. This will help to prevent infection and keep the dressing from sticking to the wound.  Have the sutures or staples removed as told by your health care provider. If skin adhesive strips were used:  Keep the wound clean and dry.  If you were given a bandage (dressing), you should change it at least one time per day or as  told by your health care provider. You should also change it if it becomes dirty or wet.  Do not get the skin adhesive strips wet. You may shower or bathe, but be careful to keep the wound dry.  If the wound gets wet, pat it dry with a clean towel. Do not rub the wound.  Skin adhesive strips fall off on their own. You may trim the strips as the wound heals. Do not remove skin adhesive strips that are still stuck to the wound. They will fall off in time. If skin glue was used:  Try to keep the wound dry, but you may briefly wet it in the shower or bath. Do not soak the wound in water, such as by swimming.  After you have showered or bathed, gently pat the wound dry with a clean towel. Do not rub the wound.  Do not do any activities that will make you sweat heavily until the skin glue has fallen off on its own.  Do not apply liquid, cream, or ointment medicine to the wound while the skin glue is in place. Using those may loosen the film before the wound has healed.  If you were given a bandage (dressing), you should change it at least one time per day or as told by your health care provider. You should also change it if it becomes dirty or wet.  If  a dressing is placed over the wound, be careful not to apply tape directly over the skin glue. Doing that may cause the glue to be pulled off before the wound has healed.  Do not pick at the glue. The skin glue usually remains in place for 5-10 days, then it falls off of the skin. General Instructions  Take over-the-counter and prescription medicines only as told by your health care provider.  If you were prescribed an antibiotic medicine or ointment, take or apply it as told by your doctor. Do not stop using it even if your condition improves.  To help prevent scarring, make sure to cover your wound with sunscreen whenever you are outside after stitches are removed, after adhesive strips are removed, or when glue remains in place and the wound  is healed. Make sure to wear a sunscreen of at least 30 SPF.  Do not scratch or pick at the wound.  Keep all follow-up visits as told by your health care provider. This is important.  Check your wound every day for signs of infection. Watch for:  Redness, swelling, or pain.  Fluid, blood, or pus.  Raise (elevate) the injured area above the level of your heart while you are sitting or lying down, if possible. SEEK MEDICAL CARE IF:  You received a tetanus shot and you have swelling, severe pain, redness, or bleeding at the injection site.  You have a fever.  A wound that was closed breaks open.  You notice a bad smell coming from your wound or your dressing.  You notice something coming out of the wound, such as wood or glass.  Your pain is not controlled with medicine.  You have increased redness, swelling, or pain at the site of your wound.  You have fluid, blood, or pus coming from your wound.  You notice a change in the color of your skin near your wound.  You need to change the dressing frequently due to fluid, blood, or pus draining from the wound.  You develop a new rash.  You develop numbness around the wound. SEEK IMMEDIATE MEDICAL CARE IF:  You develop severe swelling around the wound.  Your pain suddenly increases and is severe.  You develop painful lumps near the wound or on skin that is anywhere on your body.  You have a red streak going away from your wound.  The wound is on your hand or foot and you cannot properly move a finger or toe.  The wound is on your hand or foot and you notice that your fingers or toes look pale or bluish.   This information is not intended to replace advice given to you by your health care provider. Make sure you discuss any questions you have with your health care provider.   Document Released: 04/11/2005 Document Revised: 08/26/2014 Document Reviewed: 04/07/2014 Elsevier Interactive Patient Education 2016 [place], or Adhesive Wound Closure Health care providers use stitches (sutures), staples, and certain glue (skin adhesives) to hold skin together while it heals (wound closure). You may need this treatment after you have surgery or if you cut your skin accidentally. These methods help your skin to heal more quickly and make it less likely that you will have a scar. A wound may take several months to heal completely. The type of wound you have determines when your wound gets closed. In most cases, the wound is closed as soon as possible (primary skin closure). Sometimes, closure is delayed  so the wound can be cleaned and allowed to heal naturally. This reduces the chance of infection. Delayed closure may be needed if your wound:  Is caused by a bite.  Happened more than 6 hours ago.  Involves loss of skin or the tissues under the skin.  Has dirt or debris in it that cannot be removed.  Is infected. WHAT ARE THE DIFFERENT KINDS OF WOUND CLOSURES? There are many options for wound closure. The one that your health care provider uses depends on how deep and how large your wound is. Adhesive Glue To use this type of glue to close a wound, your health care provider holds the edges of the wound together and paints the glue on the surface of your skin. You may need more than one layer of glue. Then the wound may be covered with a light bandage (dressing). This type of skin closure may be used for small wounds that are not deep (superficial). Using glue for wound closure is less painful than other methods. It does not require a medicine that numbs the area (local anesthetic). This method also leaves nothing to be removed. Adhesive glue is often used for children and on facial wounds. Adhesive glue cannot be used for wounds that are deep, uneven, or bleeding. It is not used inside of a wound.  Adhesive Strips These strips are made of sticky (adhesive), porous paper. They are applied across  your skin edges like a regular adhesive bandage. You leave them on until they fall off. Adhesive strips may be used to close very superficial wounds. They may also be used along with sutures to improve the closure of your skin edges.  Sutures Sutures are the oldest method of wound closure. Sutures can be made from natural substances, such as silk, or from synthetic materials, such as nylon and steel. They can be made from a material that your body can break down as your wound heals (absorbable), or they can be made from a material that needs to be removed from your skin (nonabsorbable). They come in many different strengths and sizes. Your health care provider attaches the sutures to a steel needle on one end. Sutures can be passed through your skin, or through the tissues beneath your skin. Then they are tied and cut. Your skin edges may be closed in one continuous stitch or in separate stitches. Sutures are strong and can be used for all kinds of wounds. Absorbable sutures may be used to close tissues under the skin. The disadvantage of sutures is that they may cause skin reactions that lead to infection. Nonabsorbable sutures need to be removed. Staples When surgical staples are used to close a wound, the edges of your skin on both sides of the wound are brought close together. A staple is placed across the wound, and an instrument secures the edges together. Staples are often used to close surgical cuts (incisions). Staples are faster to use than sutures, and they cause less skin reaction. Staples need to be removed using a tool that bends the staples away from your skin. HOW DO I CARE FOR MY WOUND CLOSURE?  Take medicines only as directed by your health care provider.  If you were prescribed an antibiotic medicine for your wound, finish it all even if you start to feel better.  Use ointments or creams only as directed by your health care provider.  Wash your hands with soap and water before and  after touching your wound.  Do not soak  your wound in water. Do not take baths, swim, or use a hot tub until your health care provider approves.  Ask your health care provider when you can start showering. Cover your wound if directed by your health care provider.  Do not take out your own sutures or staples.  Do not pick at your wound. Picking can cause an infection.  Keep all follow-up visits as directed by your health care provider. This is important. HOW LONG WILL I HAVE MY WOUND CLOSURE?  Leave adhesive glue on your skin until the glue peels away.  Leave adhesive strips on your skin until the strips fall off.  Absorbable sutures will dissolve within several days.  Nonabsorbable sutures and staples must be removed. The location of the wound will determine how long they stay in. This can range from several days to a couple of weeks. WHEN SHOULD I SEEK HELP FOR MY WOUND CLOSURE? Contact your health care provider if:  You have a fever.  You have chills.  You have drainage, redness, swelling, or pain at your wound.  There is a bad smell coming from your wound.  The skin edges of your wound start to separate after your sutures have been removed.  Your wound becomes thick, raised, and darker in color after your sutures come out (scarring).   This information is not intended to replace advice given to you by your health care provider. Make sure you discuss any questions you have with your health care provider.   Document Released: 01/04/2001 Document Revised: 05/02/2014 Document Reviewed: 09/18/2013 Elsevier Interactive Patient Education [employer].

## 2015-07-29 NOTE — ED Notes (Signed)
Checking on pt, pt now states he much have caught himself when he fell, burning on left side

## 2015-07-29 NOTE — ED Notes (Signed)
Pt has parkinson disease, tripped and fell  Hitting a corner of wall in house, has large laceration to left side of head, bleeding controlled

## 2016-01-24 DEATH — deceased

## 2017-10-18 IMAGING — CT CT HEAD W/O CM
1 of 2 series · 15 of 30 positions shown, 19 images · non-contrast
Comparison: None.

CLINICAL DATA: 83-year-old male who tripped and fell at home with
large laceration. Parkinson. Initial encounter.

EXAM:
CT HEAD WITHOUT CONTRAST
TECHNIQUE: Contiguous axial images were obtained from the base of the skull
through the vertex without intravenous contrast.

[Series 3: headtrauma 2.4 h60s · axial · 0.44mm/px · z∈[-26,+126]mm · 15 of 72 slices shown, 19 images]
[im 4/72  brain]
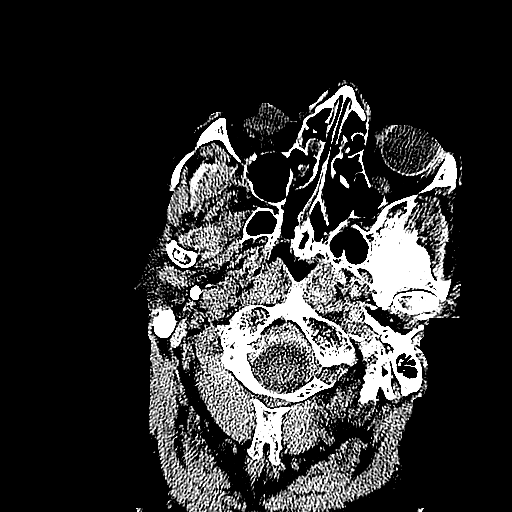
[im 4/72  bone]
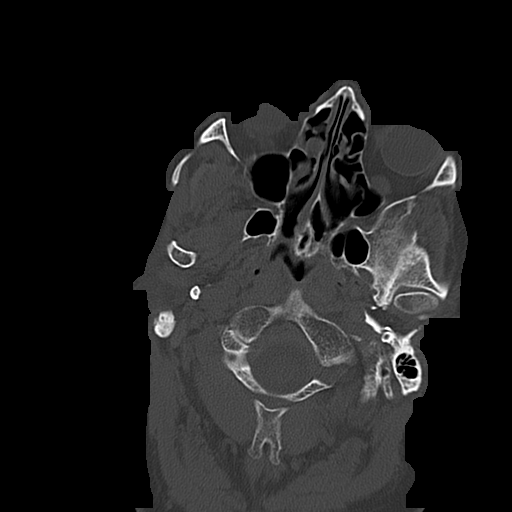
[im 8/72  brain]
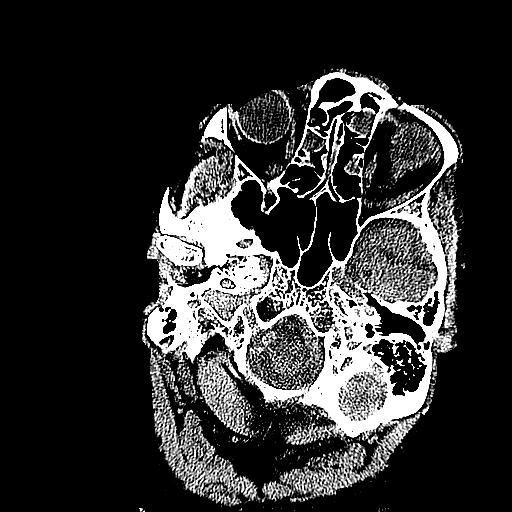
[im 15/72  brain]
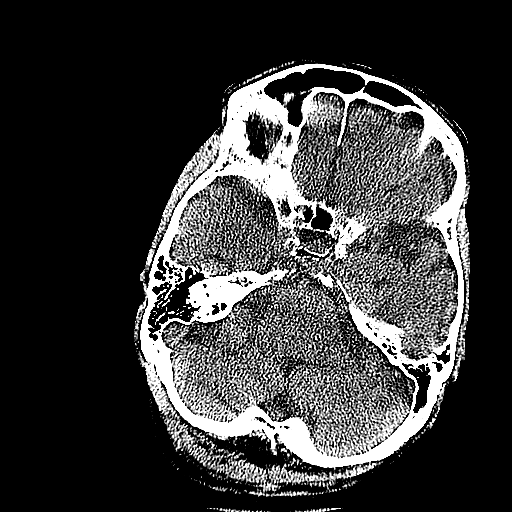
[im 19/72  brain]
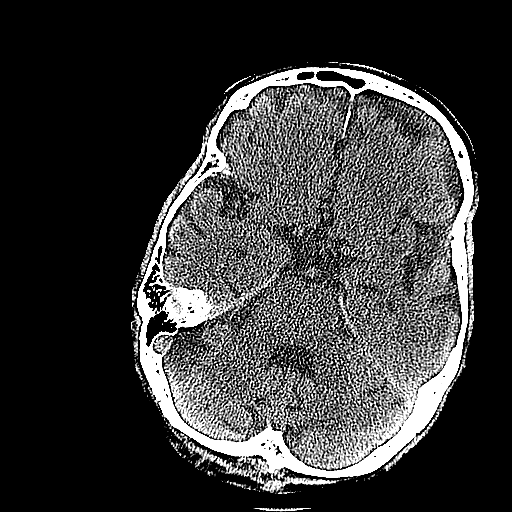
[im 23/72  brain]
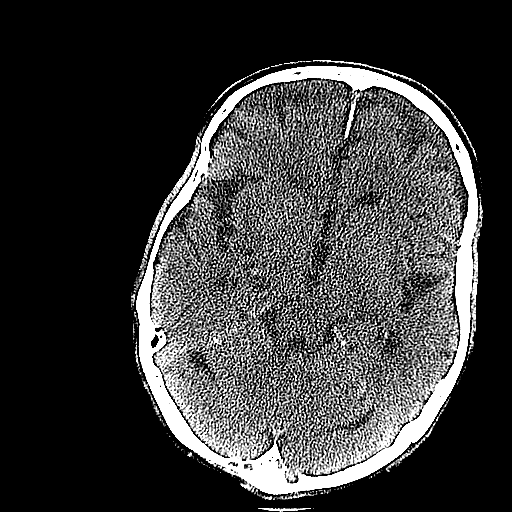
[im 23/72  bone]
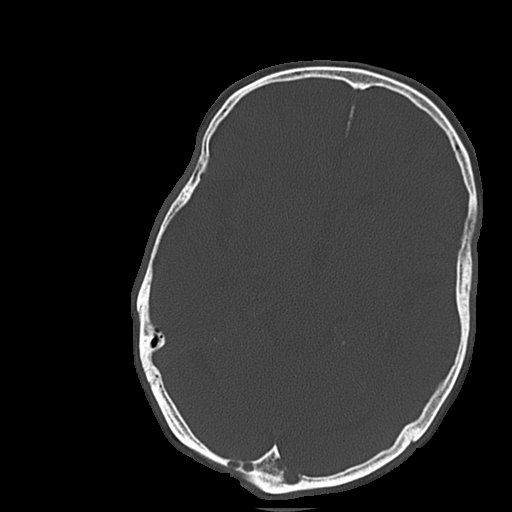
[im 27/72  brain]
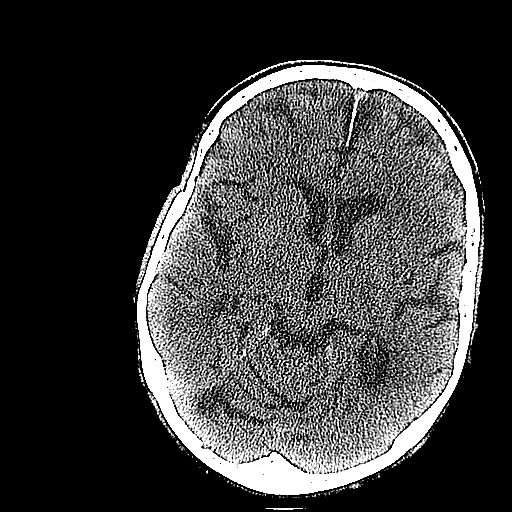
[im 30/72  brain]
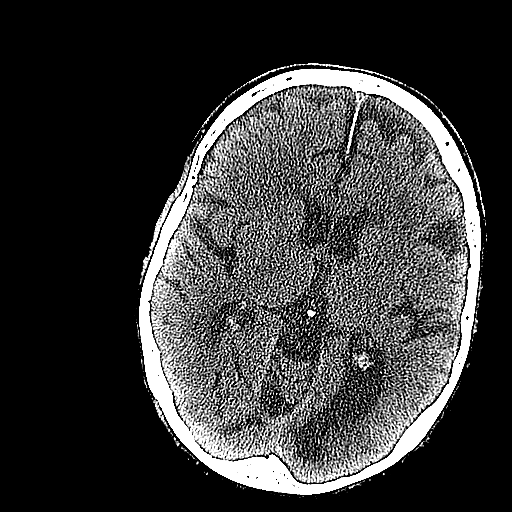
[im 38/72  brain]
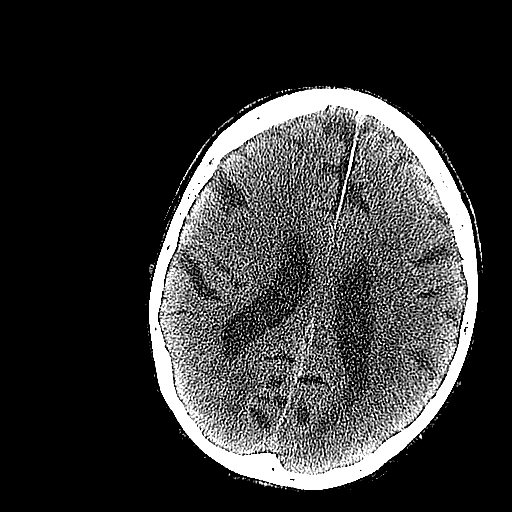
[im 42/72  brain]
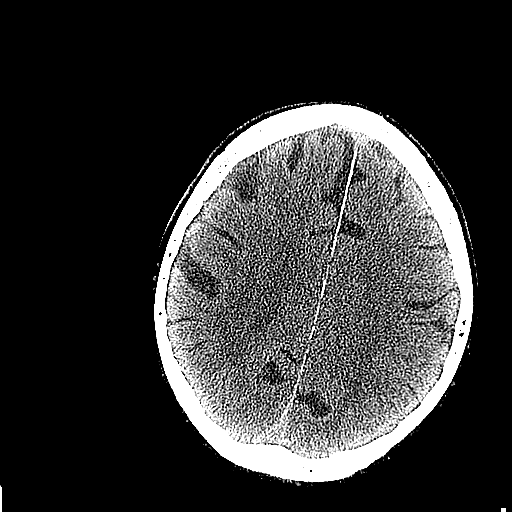
[im 42/72  bone]
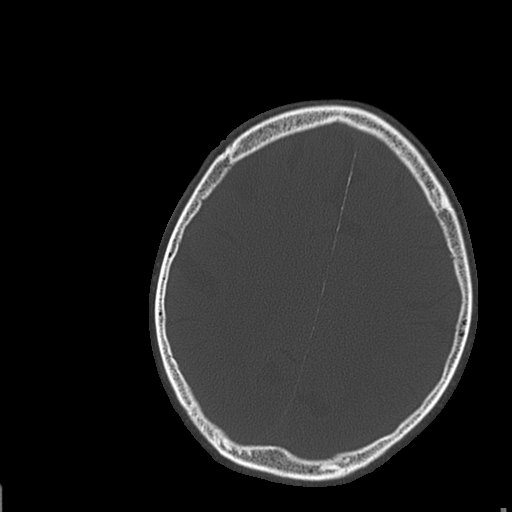
[im 45/72  brain]
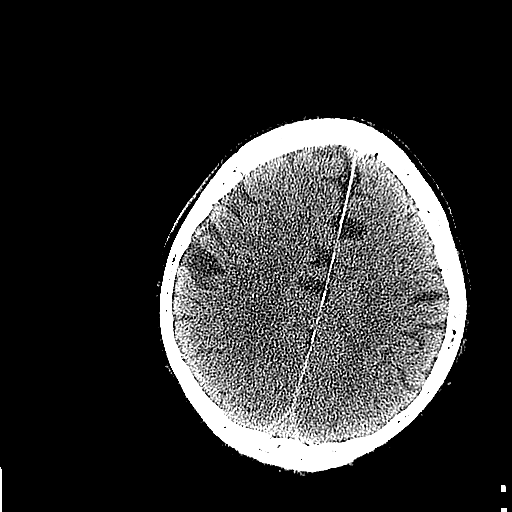
[im 49/72  brain]
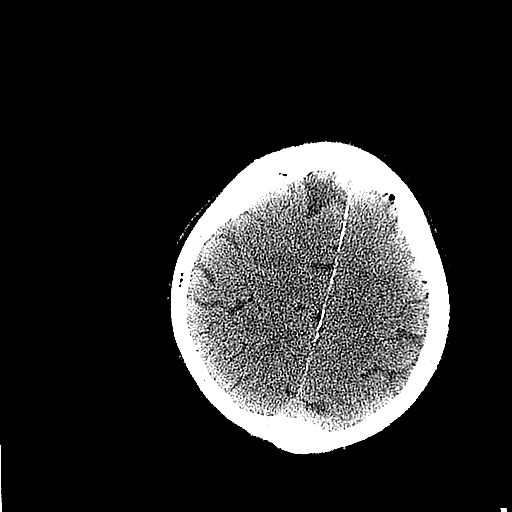
[im 53/72  brain]
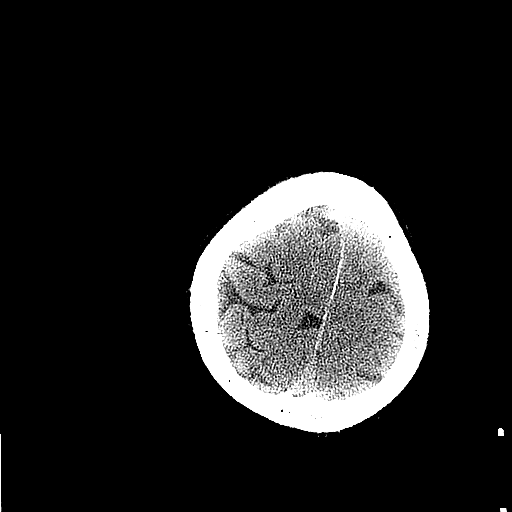
[im 60/72  brain]
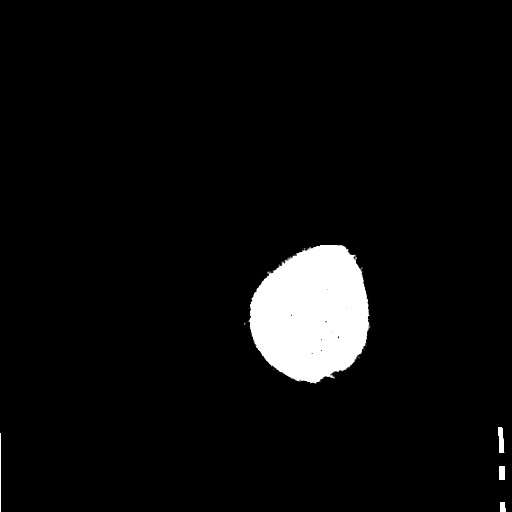
[im 60/72  bone]
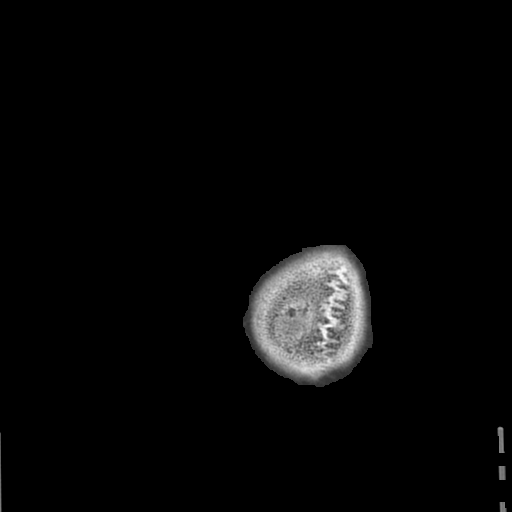
[im 64/72  brain]
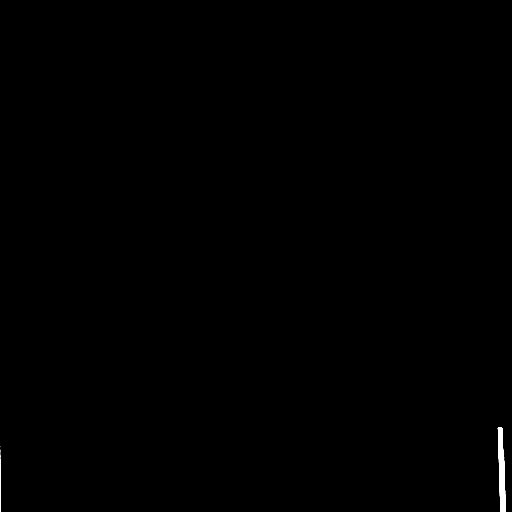
[im 68/72  brain]
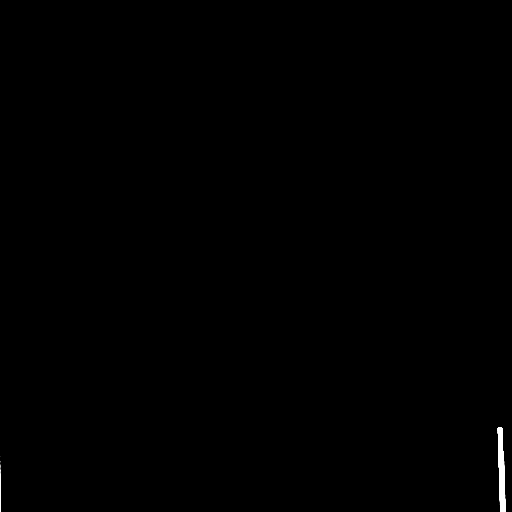

[15 of 30 positions shown; findings below may reference images not displayed]

FINDINGS: Superior left lateral scalp soft tissue injury (series 3, image 49)
with no confluent scalp hematoma. Underlying calvarium intact. No
other acute scalp or orbits soft tissue finding.

Osteopenia. No acute osseous abnormality identified. Multifocal
polypoid mucosal thickening in the visible paranasal sinuses.
Mastoids and tympanic cavities are clear.

Calcified atherosclerosis at the skull base. Generalized cerebral
volume loss. Mild probable ex vacuo enlargement at the occipital
horns. Dural calcifications. No acute intracranial hemorrhage
identified. Patchy bilateral white matter hypodensity. No cortically
based acute infarct identified.
IMPRESSION: 1. Scalp soft tissue injury without underlying fracture.
2. No acute intracranial abnormality. Mild to moderate for age
cerebral volume loss and nonspecific white matter changes.

## 2017-10-18 IMAGING — CT CT CERVICAL SPINE W/O CM
3 series · 10 of 33 positions shown, 12 images · non-contrast
Comparison: None.

CLINICAL DATA: Fall in core of house. Parkinson's disease. Neck
pain. Initial encounter.

EXAM:
CT CERVICAL SPINE WITHOUT CONTRAST
TECHNIQUE: Multidetector CT imaging of the cervical spine was performed without
intravenous contrast. Multiplanar CT image reconstructions were also
generated.

[Series 3: cervical 2.0 st axial · axial · 0.37mm/px · z∈[+96,+168]mm · 2 of 78 slices shown, 3 images]
[im 24/78  soft-tissue]
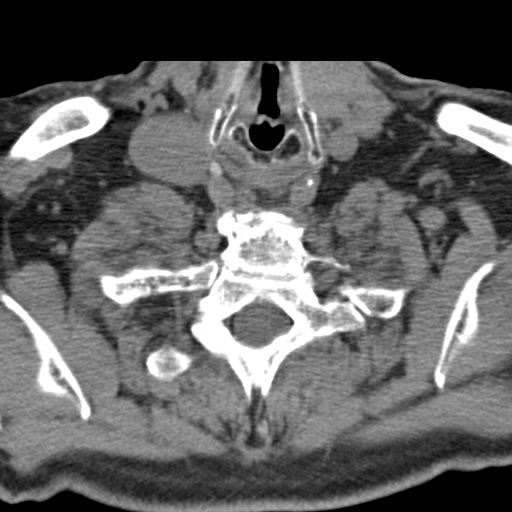
[im 24/78  bone]
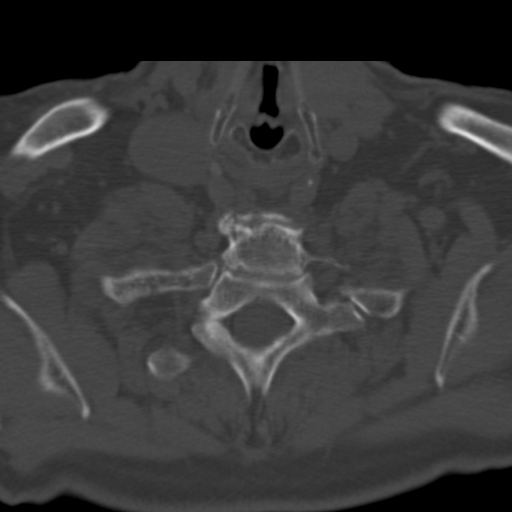
[im 60/78  bone]
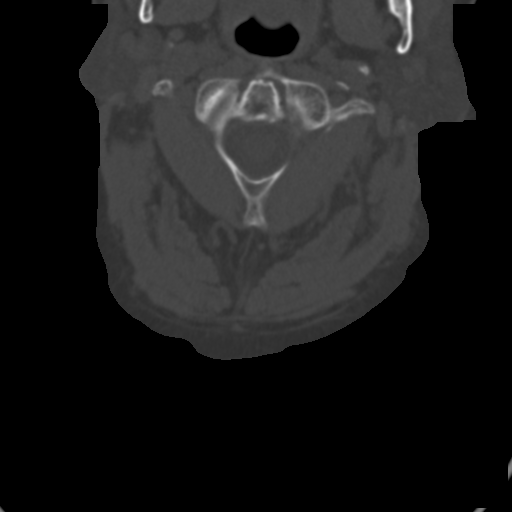

[Series 5: cervical spine sagittal bone · sagittal · 0.20mm/px · 5 of 56 slices shown, 6 images]
[im 19/56  bone]
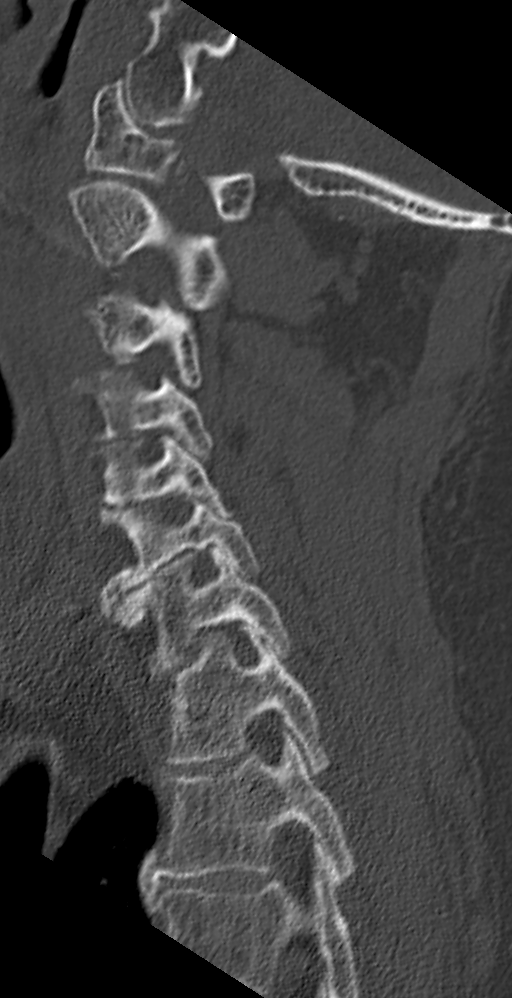
[im 23/56  bone]
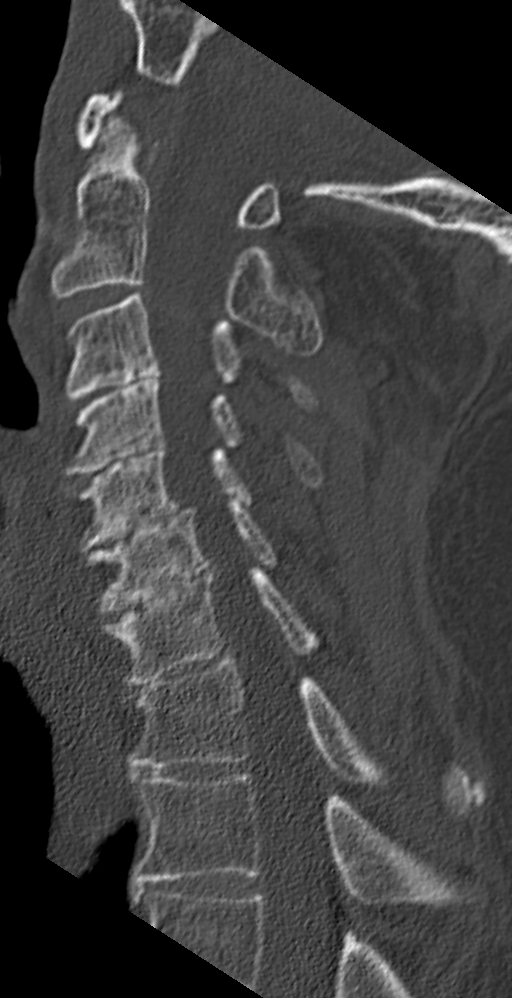
[im 28/56  soft-tissue]
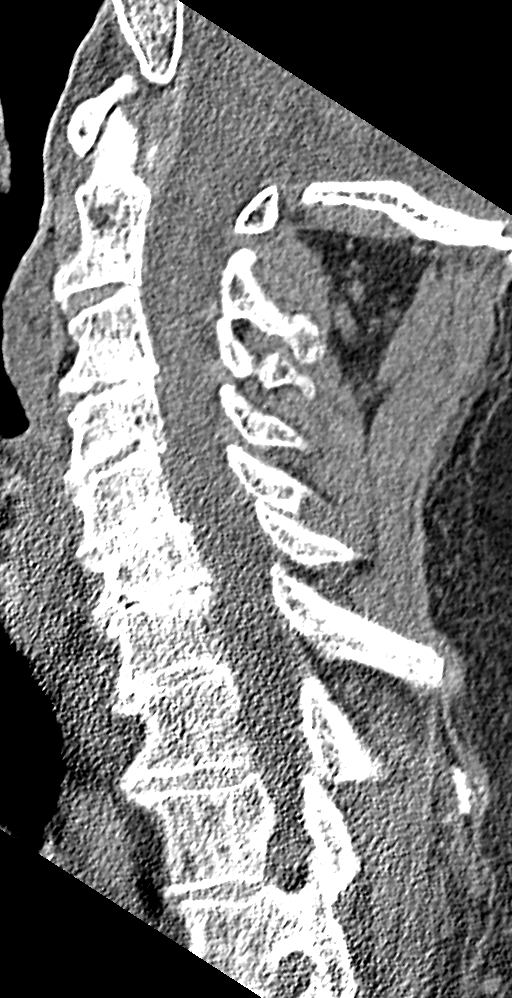
[im 28/56  bone]
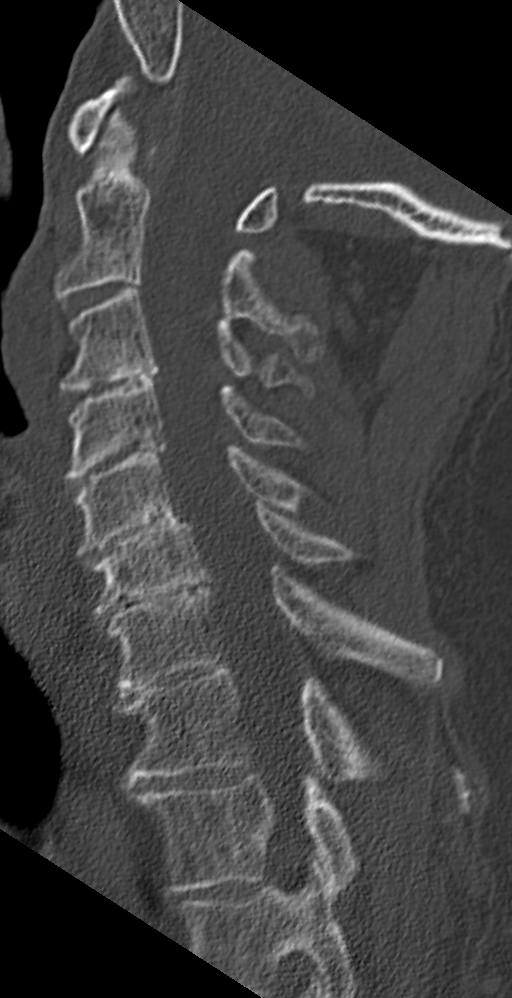
[im 33/56  bone]
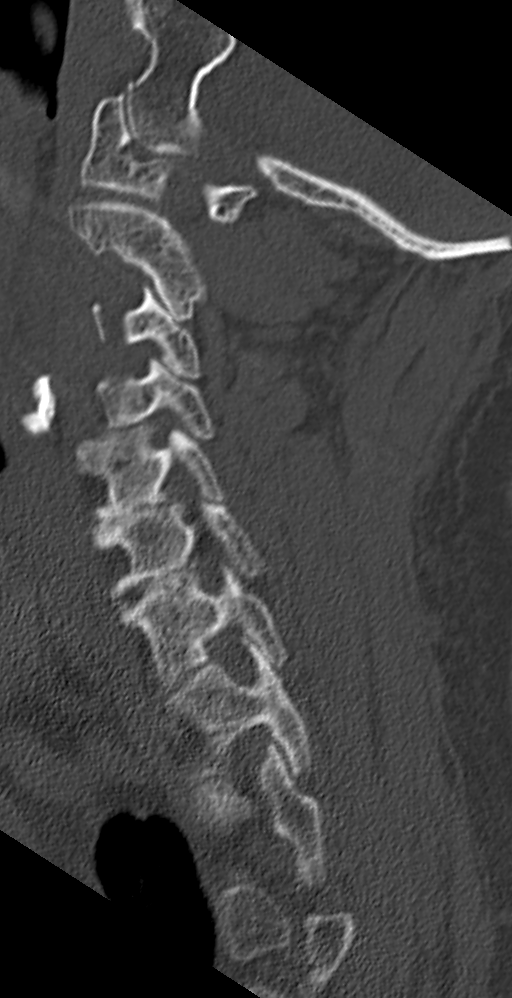
[im 37/56  bone]
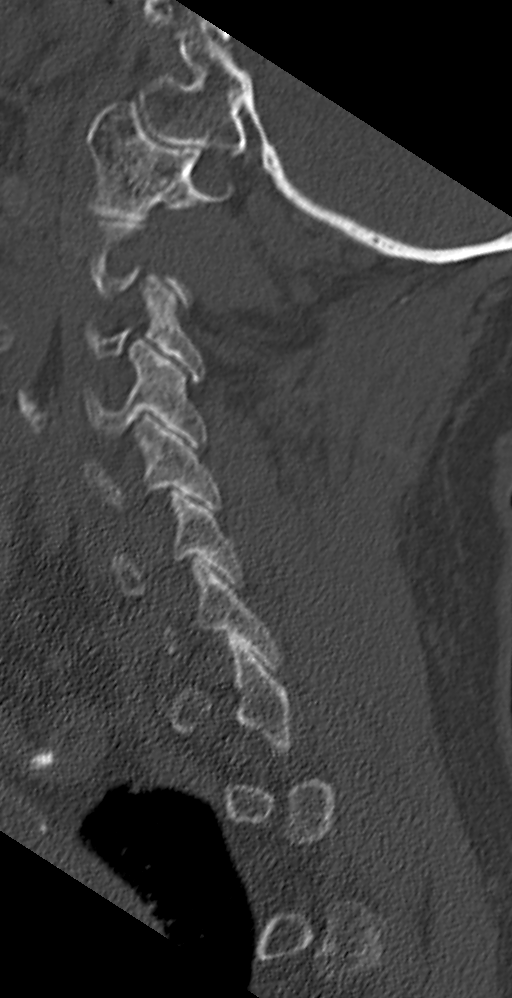

[Series 6: cervical spine coronal bone · coronal · 0.22mm/px · 3 of 50 slices shown]
[im 13/50  bone]
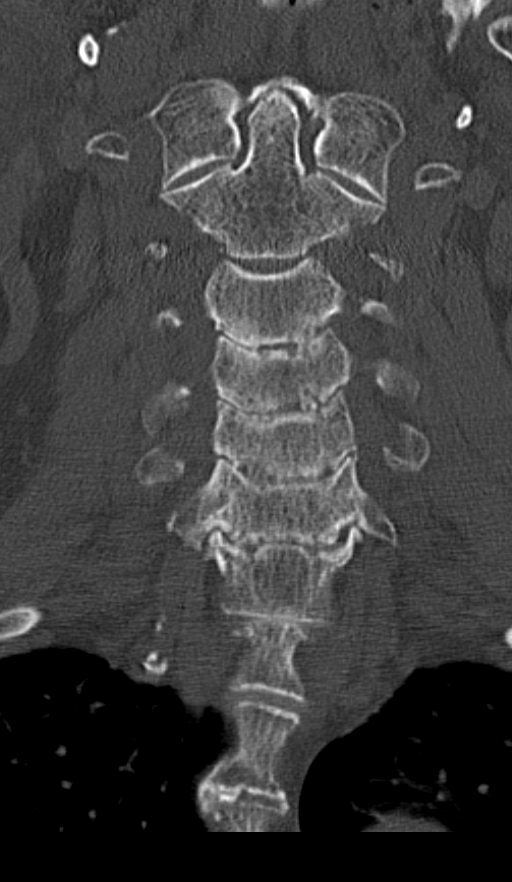
[im 21/50  bone]
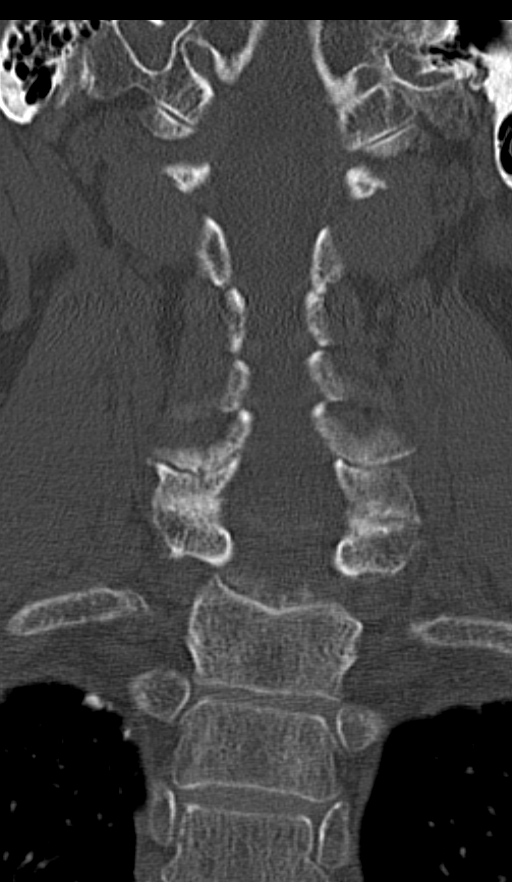
[im 29/50  bone]
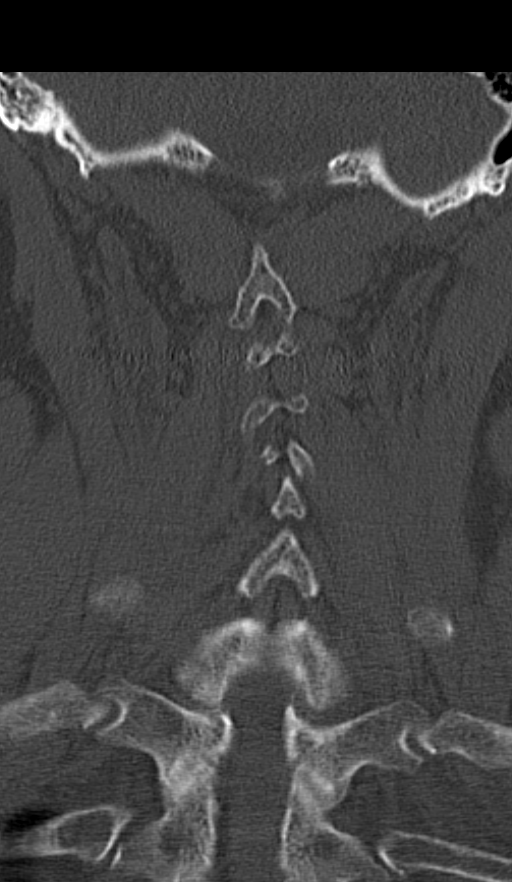

[10 of 33 positions shown; findings below may reference images not displayed]

FINDINGS: No evidence of acute fracture, subluxation, or prevertebral soft
tissue swelling.

Severe degenerative disc disease is seen from levels of C3 to T1
mild bilateral facet DJD is seen at several cervical levels. No
focal lytic or sclerotic bone lesions identified.
IMPRESSION: No evidence of acute cervical spine fracture or subluxation.
Degenerative spondylosis, as described above.
